# Patient Record
Sex: Male | Born: 1990 | Hispanic: Yes | Marital: Single | Smoking: Current every day smoker
Health system: Southern US, Community
[De-identification: ages and names within clinical notes are randomized; demographics above are authoritative.]

## PROBLEM LIST (undated history)

## (undated) DIAGNOSIS — Z9621 Cochlear implant status: Secondary | ICD-10-CM

## (undated) DIAGNOSIS — F209 Schizophrenia, unspecified: Secondary | ICD-10-CM

## (undated) HISTORY — PX: COCHLEAR IMPLANT: SUR684

---

## 2013-01-15 ENCOUNTER — Emergency Department: Payer: Self-pay | Admitting: Emergency Medicine

## 2013-01-15 LAB — COMPREHENSIVE METABOLIC PANEL
Albumin: 4 g/dL (ref 3.8–5.6)
Alkaline Phosphatase: 64 U/L — ABNORMAL LOW (ref 98–317)
Anion Gap: 9 (ref 7–16)
Bilirubin,Total: 1 mg/dL (ref 0.2–1.0)
Calcium, Total: 8.7 mg/dL — ABNORMAL LOW (ref 9.0–10.7)
Chloride: 108 mmol/L — ABNORMAL HIGH (ref 98–107)
Co2: 23 mmol/L (ref 21–32)
Creatinine: 1.11 mg/dL (ref 0.60–1.30)
EGFR (African American): >60
EGFR (Non-African Amer.): >60
Potassium: 3.4 mmol/L — ABNORMAL LOW (ref 3.5–5.1)
SGOT(AST): 31 U/L (ref 10–41)
Sodium: 140 mmol/L (ref 136–145)
Total Protein: 6.9 g/dL (ref 6.4–8.6)

## 2013-01-15 LAB — CK TOTAL AND CKMB (NOT AT ARMC): CK-MB: 2.1 ng/mL (ref 0.5–3.6)

## 2013-01-15 LAB — URINALYSIS, COMPLETE
Bilirubin,UR: NEGATIVE
Blood: NEGATIVE
Glucose,UR: NEGATIVE mg/dL (ref 0–75)
Hyaline Cast: 4
Nitrite: NEGATIVE
Ph: 7 (ref 4.5–8.0)
Protein: NEGATIVE
RBC,UR: <1 /HPF (ref 0–5)
Squamous Epithelial: NONE SEEN

## 2013-01-15 LAB — DRUG SCREEN, URINE
Amphetamines, Ur Screen: NEGATIVE (ref ?–1000)
Barbiturates, Ur Screen: NEGATIVE (ref ?–200)
Benzodiazepine, Ur Scrn: NEGATIVE (ref ?–200)
Cannabinoid 50 Ng, Ur ~~LOC~~: POSITIVE (ref ?–50)
MDMA (Ecstasy)Ur Screen: NEGATIVE (ref ?–500)
Opiate, Ur Screen: NEGATIVE (ref ?–300)
Phencyclidine (PCP) Ur S: NEGATIVE (ref ?–25)

## 2013-01-15 LAB — CBC WITH DIFFERENTIAL/PLATELET
Basophil #: 0 10*3/uL (ref 0.0–0.1)
Basophil %: 0.3 %
Eosinophil #: 0 10*3/uL (ref 0.0–0.7)
HCT: 50.2 % (ref 40.0–52.0)
Lymphocyte #: 1.1 10*3/uL (ref 1.0–3.6)
MCH: 28.9 pg (ref 26.0–34.0)
MCHC: 33.7 g/dL (ref 32.0–36.0)
MCV: 86 fL (ref 80–100)
Monocyte #: 0.8 x10 3/mm (ref 0.2–1.0)
Neutrophil %: 83.2 %
RDW: 12.6 % (ref 11.5–14.5)
WBC: 11.8 10*3/uL — ABNORMAL HIGH (ref 3.8–10.6)

## 2013-01-15 LAB — TROPONIN I: Troponin-I: <0.02 ng/mL

## 2013-01-15 LAB — ETHANOL
Ethanol %: <0.003 % (ref 0.000–0.080)
Ethanol: <3 mg/dL

## 2013-01-15 LAB — TSH: Thyroid Stimulating Horm: 1.66 u[IU]/mL

## 2014-02-21 LAB — COMPREHENSIVE METABOLIC PANEL
ALT: 19 U/L (ref 12–78)
ANION GAP: 10 (ref 7–16)
Albumin: 4.3 g/dL (ref 3.4–5.0)
Alkaline Phosphatase: 68 U/L
BUN: 12 mg/dL (ref 7–18)
Bilirubin,Total: 0.6 mg/dL (ref 0.2–1.0)
CALCIUM: 9.1 mg/dL (ref 8.5–10.1)
CREATININE: 0.91 mg/dL (ref 0.60–1.30)
Chloride: 101 mmol/L (ref 98–107)
Co2: 27 mmol/L (ref 21–32)
EGFR (African American): >60
EGFR (Non-African Amer.): >60
Glucose: 143 mg/dL — ABNORMAL HIGH (ref 65–99)
Osmolality: 278 (ref 275–301)
Potassium: 3.7 mmol/L (ref 3.5–5.1)
SGOT(AST): 18 U/L (ref 15–37)
SODIUM: 138 mmol/L (ref 136–145)
Total Protein: 8 g/dL (ref 6.4–8.2)

## 2014-02-21 LAB — CBC
HCT: 53.1 % — ABNORMAL HIGH (ref 40.0–52.0)
HGB: 17.9 g/dL (ref 13.0–18.0)
MCH: 29.3 pg (ref 26.0–34.0)
MCHC: 33.8 g/dL (ref 32.0–36.0)
MCV: 87 fL (ref 80–100)
Platelet: 193 10*3/uL (ref 150–440)
RBC: 6.13 10*6/uL — ABNORMAL HIGH (ref 4.40–5.90)
RDW: 13 % (ref 11.5–14.5)
WBC: 8.8 10*3/uL (ref 3.8–10.6)

## 2014-02-21 LAB — URINALYSIS, COMPLETE
BILIRUBIN, UR: NEGATIVE
Bacteria: NONE SEEN
Glucose,UR: NEGATIVE mg/dL (ref 0–75)
Ketone: NEGATIVE
Leukocyte Esterase: NEGATIVE
Nitrite: NEGATIVE
PH: 6 (ref 4.5–8.0)
Protein: NEGATIVE
SQUAMOUS EPITHELIAL: NONE SEEN
Specific Gravity: 1.006 (ref 1.003–1.030)
WBC UR: <1 /HPF (ref 0–5)

## 2014-02-21 LAB — DRUG SCREEN, URINE

## 2014-02-21 LAB — ACETAMINOPHEN LEVEL: Acetaminophen: <2 ug/mL

## 2014-02-21 LAB — ETHANOL: Ethanol: <3 mg/dL

## 2014-02-21 LAB — SALICYLATE LEVEL: Salicylates, Serum: <1.7 mg/dL

## 2014-02-22 ENCOUNTER — Inpatient Hospital Stay: Payer: Self-pay | Admitting: Psychiatry

## 2014-04-05 IMAGING — CT CT HEAD WITHOUT CONTRAST
1 series · 16 of 30 positions shown, 20 images · non-contrast
Comparison: none

REASON FOR EXAM: ams
COMMENTS:

PROCEDURE:     CT  - CT HEAD WITHOUT CONTRAST  - January 15, 2013  [DATE]
RESULT:     Comparison:  None
TECHNIQUE: Multiple axial images from the foramen magnum to the vertex were
obtained without IV contrast.

[Series 2: soft tissue · axial · 0.38mm/px · z∈[+366,+501]mm · 16 of 31 slices shown, 20 images]
[im 2/31  brain]
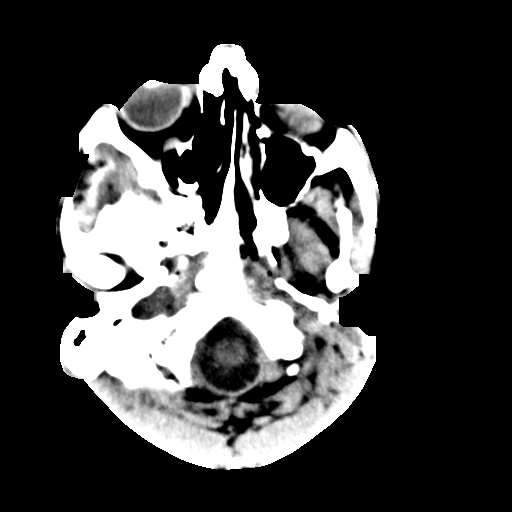
[im 2/31  bone]
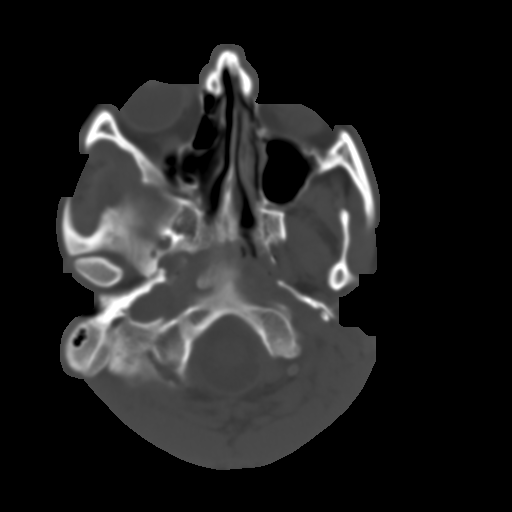
[im 4/31  brain]
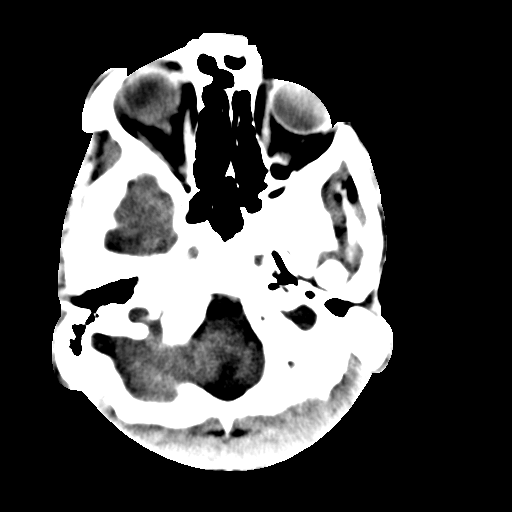
[im 6/31  brain]
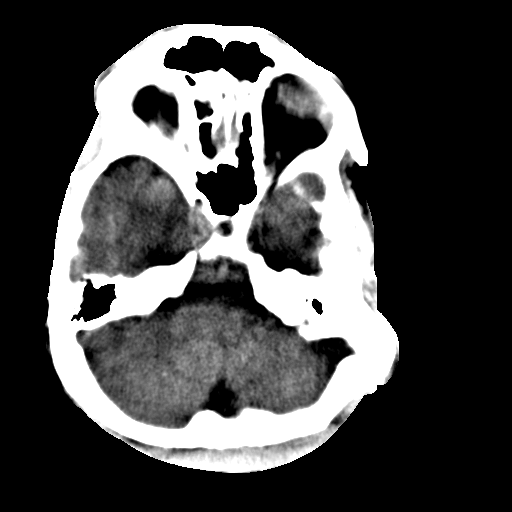
[im 8/31  brain]
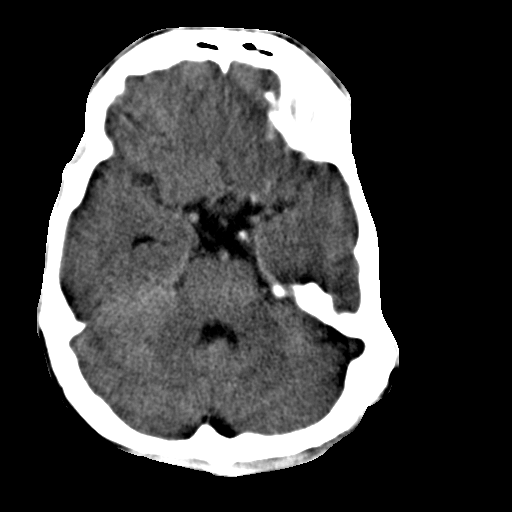
[im 9/31  brain]
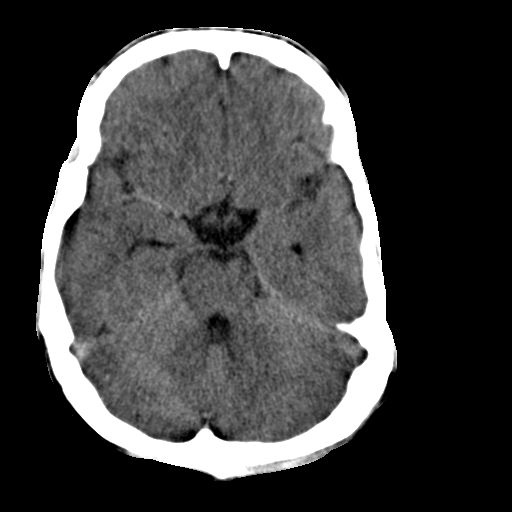
[im 9/31  bone]
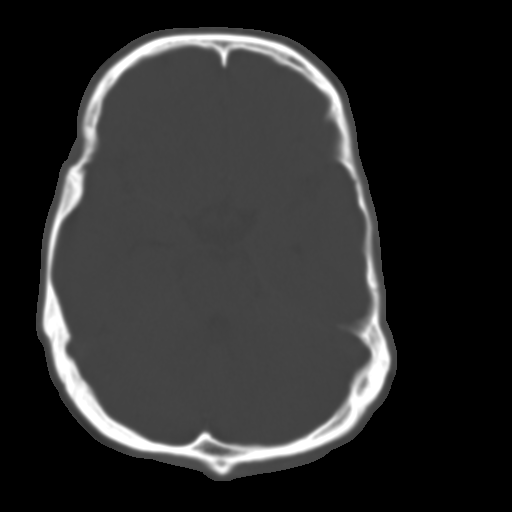
[im 11/31  brain]
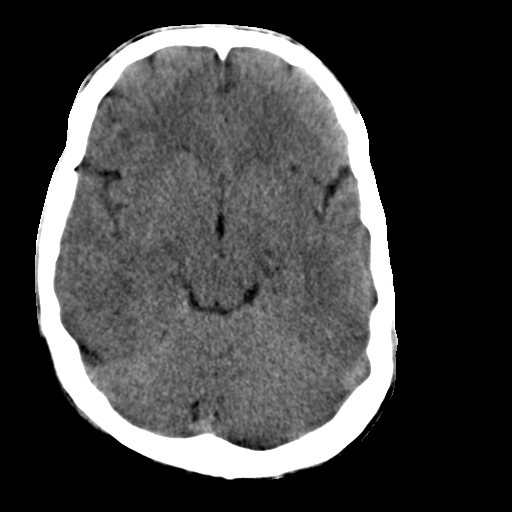
[im 13/31  brain]
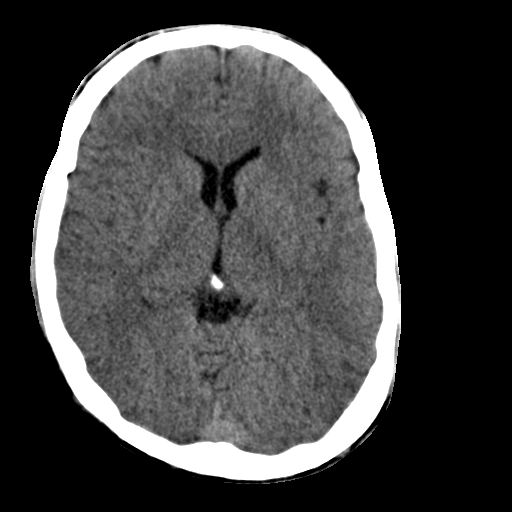
[im 15/31  brain]
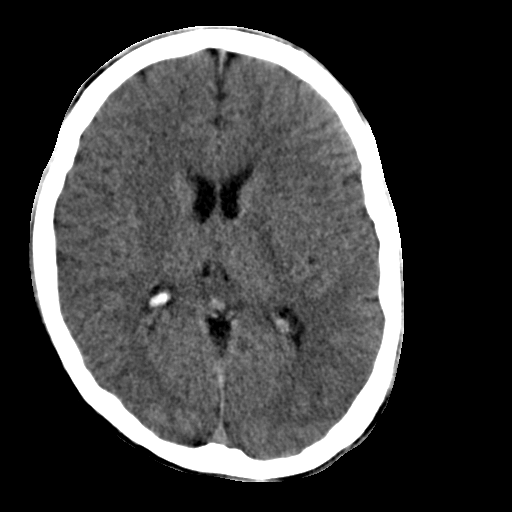
[im 16/31  brain]
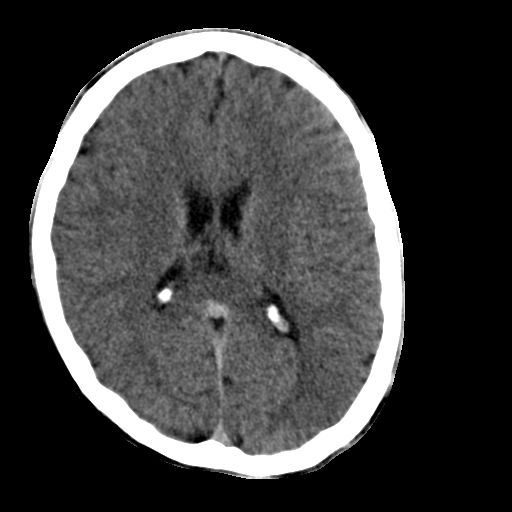
[im 16/31  bone]
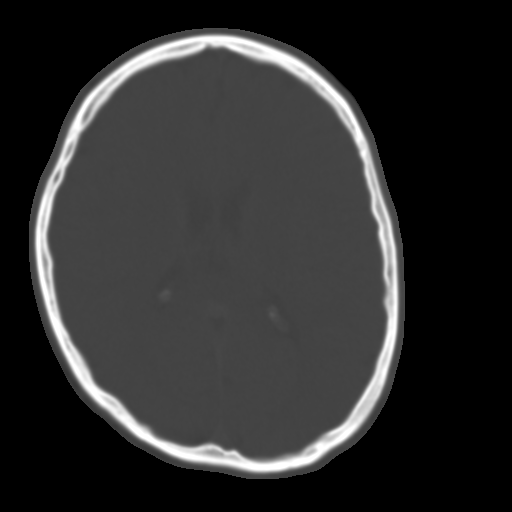
[im 18/31  brain]
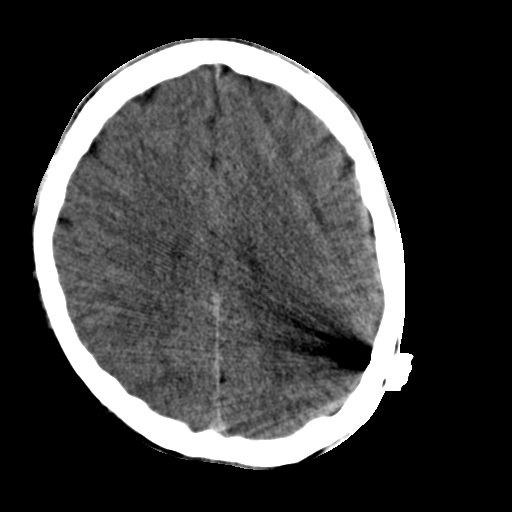
[im 20/31  brain]
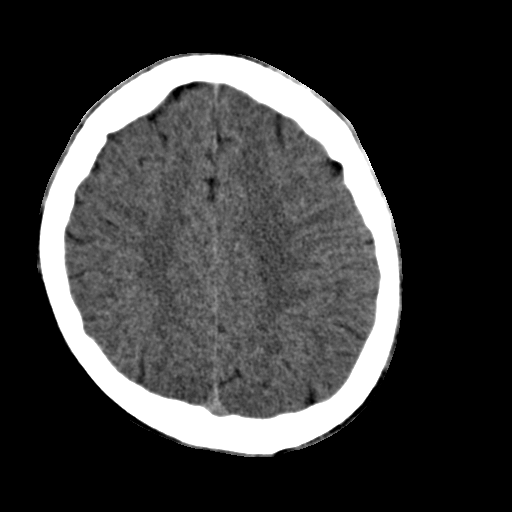
[im 22/31  brain]
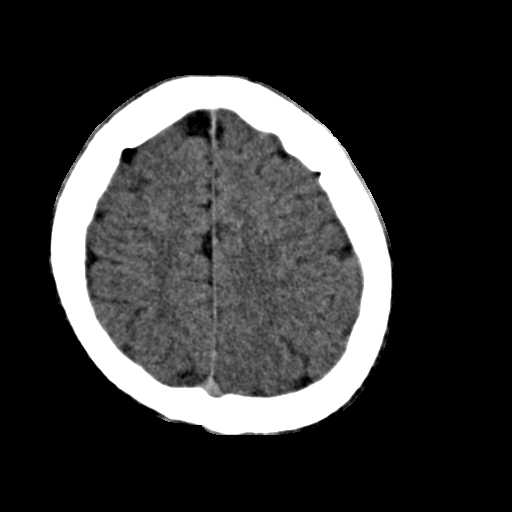
[im 23/31  brain]
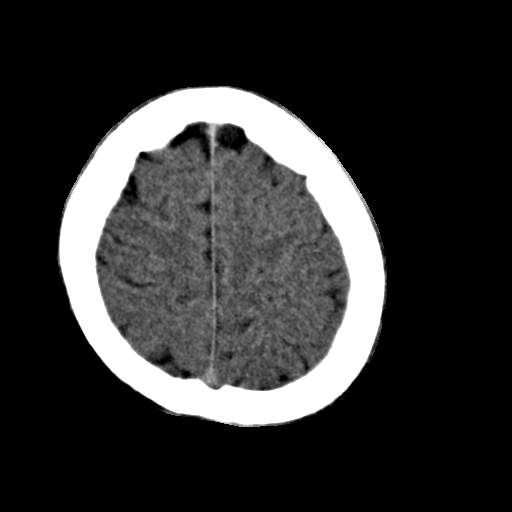
[im 23/31  bone]
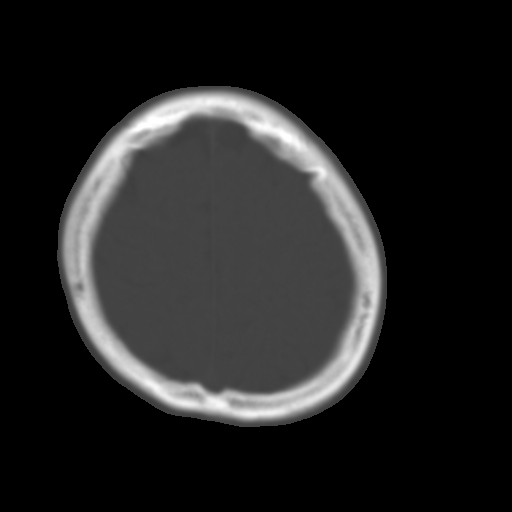
[im 25/31  brain]
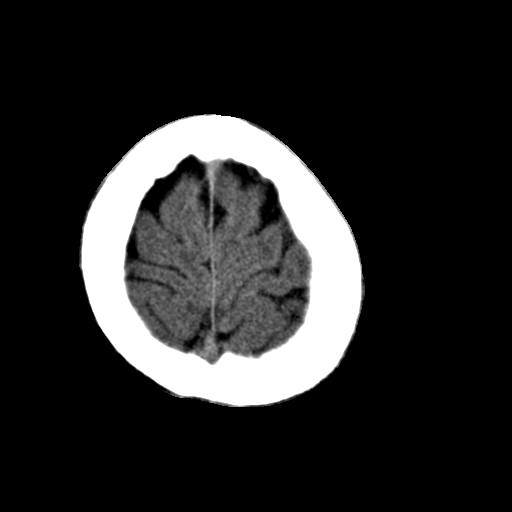
[im 27/31  brain]
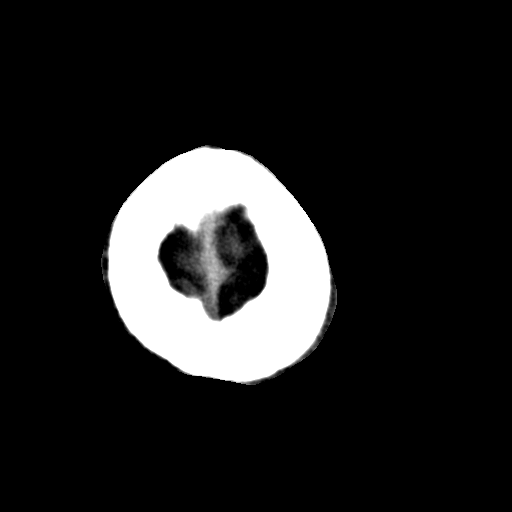
[im 29/31  brain]
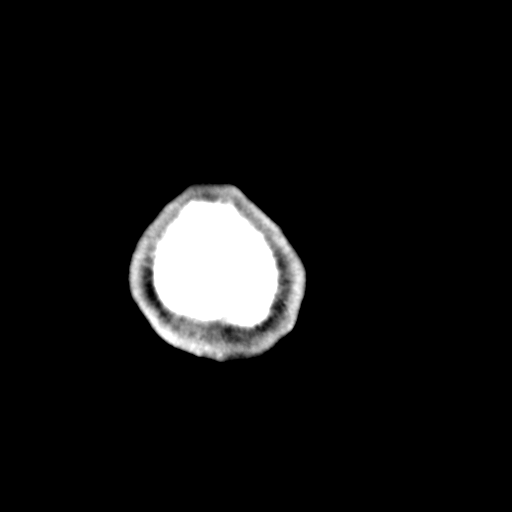

[16 of 30 positions shown; findings below may reference images not displayed]

FINDINGS: There is a metallic density within the left parietal skull and scalp which
is of uncertain etiology. Streak artifact from this density limits
evaluation in this region. There is no evidence for mass effect, midline
shift, or extra-axial fluid collections. There is no evidence for
space-occupying lesion, intracranial hemorrhage, or cortical-based area of
infarction.

The left mastoid air cells are relatively hypoplastic.
IMPRESSION: 1. No acute intracranial findings.
2. There is a metallic object in the left parietal skull and scalp which is
of uncertain etiology. Clinical correlation is recommended.

[REDACTED]

## 2015-01-26 NOTE — Discharge Summary (Signed)
PATIENT NAME:  Justin Schaefer, Slayton E MR#:  [id number] DATE OF BIRTH:  11/23/1990  DATE OF ADMISSION:  02/22/2014 DATE OF DISCHARGE:  02/28/2014   HOSPITAL COURSE:  See dictated history and physical for details of the admission. A 24 year old man presented to the hospital with symptoms of auditory hallucinations, psychotic, disorganized thinking. History strongly suggested new onset schizophrenia. In the hospital, the patient has stayed withdrawn quite a bit of the time. He engages in appropriate conversation when approached, however, and has attended some groups. He was cooperative with medication. He was started on haloperidol. After several days, he showed a resolution in his psychotic symptoms, but also had a dystonic reaction with stiffening of his jaw and neck. He was treated with temporary intramuscular Benadryl and increases in Cogentin. I have cut his Haldol dose back. He currently is taking his current Haldol dose and not having any side effect from it. The patient has been counseled about schizophrenia and the importance of staying on his medicine. He has been counseled about the importance of followup treatment in the community. We are hoping that he can make sure he gets his Medicaid reinstated. He is to follow up with [facility] in the community. At the time of discharge, there is no sign of acute dangerousness. CAT scan done on the first day showed his hearing aid, which is implanted, but did not show any brain abnormality.   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 Admission chemistry: Slightly elevated glucose on nonfasting draw. Alcohol level negative. Drug screen negative. CBC: Elevated hematocrit of 53.1. Urinalysis: Unremarkable.   DISCHARGE MEDICATIONS:  Haloperidol 1 mg twice a day, Cogentin 0.5 mg twice a day.   MENTAL STATUS EXAMINATION AT DISCHARGE: Casually dressed, neatly groomed man, looks his  stated age, cooperative with the interview. Good eye contact, normal psychomotor activity. Speech is quiet and decreased in amount. Affect blunted. Mood stated as fine. Thoughts appear to be slow but lucid. No bizarre thinking. Denies auditory or visual hallucinations. Denies any suicidal or homicidal ideas at all. Alert and oriented x 4. Judgment and insight improved. Normal intelligence. Normal fund of knowledge. Short and long-term memory intact.   DISPOSITION:  Discharge back to his home and follow up with [facility].   DIAGNOSIS, PRINCIPAL AND PRIMARY:  AXIS I: Schizophrenia, paranoid type.   SECONDARY DIAGNOSES: AXIS I:  No further.  AXIS II:  No diagnosis.  AXIS III:  Congenital deafness.  AXIS IV:  Moderate to severe from financial difficulties.  AXIS V:  Functioning at time of discharge 55.    ____________________________ Audery AmelJohn T. Clapacs, MD jtc:dmm D: 02/28/2014 15:03:25 ET T: 02/28/2014 22:07:34 ET JOB#: [id number]  cc: Audery AmelJohn T. Clapacs, MD, <Dictator> Audery AmelJOHN T CLAPACS MD ELECTRONICALLY SIGNED 03/21/2014 13:20

## 2015-01-26 NOTE — H&P (Signed)
PATIENT NAME:  Justin Schaefer, Justin Schaefer MR#:  [id number] DATE OF BIRTH:  Jun 27, 1991  DATE OF ADMISSION:  02/21/2014  HISTORY OF PRESENT ILLNESS: The patient is a 24 year old Hispanic male who presented to the ER accompanied by his family members. He stated that he currently lives with his brother, sister and father. He stated that he has been hearing voices which are getting stronger. He stated that he feels paranoid, that the bugs are crawling, and he could not determine what is going on. He mentioned about the karma which has been going on for the past 1 year. The patient feels that sometimes somebody will push him and he has some special powers. He also mentioned that the TV and radio communicate with him. The patient mentioned that he is having command auditory hallucinations and the voices are calling him to either hurt himself or to hurt other people. The patient reported that he does not take any medications. He reported that he feels scared and so he decided to come to the hospital to seek help.   PAST PSYCHIATRIC HISTORY: The patient reported that he has never seen a psychiatrist and does not take any medications.   SUBSTANCE ABUSE HISTORY: The patient currently denied using any drugs or alcohol.   SOCIAL HISTORY: He currently lives with his family including his brother, sister, and father. [place].   SUBSTANCE ABUSE HISTORY: He denies using drugs or alcohol.   PAST MEDICAL HISTORY: The patient denies having any medical issues.   SOCIAL HISTORY: The patient reported that he has never been admitted to a psychiatric facility.   ANCILLARY DATA: Temperature 97.6, pulse 72, respirations 20, blood pressure 144/81.  LABORATORY DATA: Glucose 143, BUN 12, creatinine 0.91, sodium 138, potassium 3.7, chloride 101, bicarbonate 27, anion gap 10, calcium 9.1. Blood alcohol level less than 3. Protein 8, albumin 4.3, bilirubin 0.6, alkaline phosphatase 68, ALT 19, AST 18. UDS is negative.  WBC 8.8, RBC 6.13, hemoglobin 17.9, platelet count 193,000, MCV 87, RDW 13.  REVIEW OF SYSTEMS:  CONSTITUTIONAL: Denies any fever or chills. No weight changes.  EYES: No double or blurred vision.  RESPIRATORY: No shortness of breath or cough.  CARDIOVASCULAR: No chest pain or orthopnea.  GASTROINTESTINAL: No abdominal pain, nausea, vomiting or diarrhea.  GENITOURINARY: No incontinence or frequency.  ENDOCRINE: No heat or cold intolerance.  LYMPHATIC: No anemia or easy bruising.  INTEGUMENTARY: No acne or rash.   MENTAL STATUS EXAMINATION: The patient is a short statured male who was sitting in the bed. He maintained fair eye contact. His gait and station appears within normal limits. Speech was low in tone and volume. Thought process was tangential. Admits to having auditory and visual hallucinations. Denied having any suicidal ideations. Demonstrated poor insight and judgment. Awake, alert, and oriented x3. Attention span and concentration were normal. Mood was depressed. Affect was blunted.   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  AXIS II: None.  AXIS III: None. AXIS IV: Severe, current global assessment of functioning 25.   TREATMENT PLAN: 1.  The patient is currently on involuntary commitment and will be admitted to the inpatient behavioral health unit for stabilization and safety.  2.  He is started on Haldol 5 mg p.o. b.i.d. for his paranoia.  3.  We will also add Cogentin 0.5 mg p.o. b.i.d.   4.  He will get trazodone to help with insomnia. Will obtain collateral information from his family members.  He will be involved in group and milieu therapy while in the inpatient unit.  Thank you for allowing me to participate in the care of this patient.    ____________________________ Ardeen Fillers. Garnetta Buddy, MD usf:sb D: 02/22/2014 16:32:24 ET T: 02/22/2014 16:52:39 ET JOB#: [id number]  cc: Ardeen Fillers. Garnetta Buddy, MD, <Dictator> Rhunette Croft MD ELECTRONICALLY SIGNED 02/27/2014 16:04

## 2015-01-26 NOTE — Consult Note (Signed)
PATIENT NAME:  Justin Schaefer, Justin Schaefer MR#:  [id number] DATE OF BIRTH:  [birth year]  DATE OF CONSULTATION:  02/22/2014  REFERRING PHYSICIAN:  Glennie Isle, MD CONSULTING PHYSICIAN:  Ardeen Fillers. Garnetta Buddy, MD  REASON FOR CONSULTATION: "I am hearing voices."    HISTORY OF PRESENT ILLNESS: The patient is a 24 year old Hispanic male who presented to the ED by the police stating that he called the police and told them that I am hearing voices. During my interview, the patient reported that he has been hearing voices and feels that the food is contaminated. The patient was having some difficulty explaining his symptoms. However, he reported that he has some paranormal activity and feels that there are bugs. He reported there is some karma going on. He stated that he has been having these symptoms for the past 1 year. He reported that sometimes that they are special powers who will push him. He reported that he can communicate through the TV and hear voices through the radio that are talking to him. He reported that the voices are asking him to kill himself. He reported that he called the police as the voices were getting stronger and they were asking him to hurt himself as well as others. The patient reported that there are different voices and sometimes they communicate to each other. He reported that he was getting scared so he decided to come to the hospital. The patient stated that he lives with his brother and sister as well as his father, but his mother is currently in [place]. He appeared calm and collective when he was trying to explain all these symptoms.   PAST PSYCHIATRIC HISTORY: The patient reported that he does not see any psychiatrist at this time and is not taking any medications. He does not use any drugs or alcohol.   PAST MEDICAL HISTORY: The patient currently denied any medical issues at this time.   ALLERGIES: No known drug allergies.  SUBSTANCE ABUSE HISTORY: The patient denied any history of  using any illicit drugs at this time.   SOCIAL HISTORY: The patient reported that he stays at home and lives with his family members. His mother is in [place]. No pending legal charges.   ANCILLARY DATA: Temperature 97.6, pulse 72, respirations 20, blood pressure 144/81.  LABORATORY DATA: Glucose 143, BUN 12, creatinine 0.91, sodium 138, potassium 3.7, chloride 101, bicarbonate 27, anion gap 10, osmolality 278, calcium 9.1. Blood alcohol less than 3. Protein 8, albumin 4.3, bilirubin 0.6, alkaline phosphatase 68, AST 18, ALT 19. UDS is negative. WBC 8.8, RBC 6.13, hemoglobin 17.9, hematocrit 3.1, platelet count 193,000, MCV 87, RDW 13.  REVIEW OF SYSTEMS:  CONSTITUTIONAL: Denied any fever or chills. Negative for weight loss.  EYES: No double or blurred vision.  ENT: No hearing loss.  RESPIRATORY: No shortness of breath or cough.  CARDIOVASCULAR: No chest pain or orthopnea.  GASTROINTESTINAL: No abdominal pain, nausea, vomiting or diarrhea.  GENITOURINARY: No incontinence or frequency.  ENDOCRINE: No heat or cold intolerance.  LYMPHATIC: No anemia or easy bruising.  INTEGUMENTARY: No acne or rash.  MUSCULOSKELETAL: No muscle or joint pain.  NEUROLOGIC: No history of seizures.   MENTAL STATUS EXAMINATION: The patient is a thinly built male who appeared his stated age. He was calm and cooperative and sitting in the bed. He was awake, alert and oriented x3. He maintained fair eye contact. His mood was depressed and affect was blunted. He reported having auditory hallucinations with the voices talking to  each other. He also admits to having command auditory hallucinations. He demonstrated fair insight and judgment. His memory appears intact.   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  AXIS II: None.  AXIS III: None reported.  AXIS IV: Severe mental illness.  AXIS V: Current global assessment of functioning 25.   TREATMENT PLAN: 1.   The patient is currently on involuntary commitment and will be admitted to the inpatient behavioral health unit for stabilization and safety.  2.  I will start him on Haldol 5 mg p.o. b.i.d.  3.  I will also start him on Cogentin 0.5 mg p.o. b.i.d.  4.  He will be given trazodone 50 mg at bedtime for insomnia. The patient will be monitored closely by the behavioral health staff and his medications will be adjusted according to his needs.   Thank you for allowing me to participate in the care of this patient.   ____________________________ Ardeen FillersUzma S. Garnetta BuddyFaheem, MD usf:sb D: 02/22/2014 14:52:26 ET T: 02/22/2014 17:08:34 ET JOB#: [id number]  cc: Ardeen FillersUzma S. Garnetta BuddyFaheem, MD, <Dictator> Rhunette CroftUZMA S Takelia Urieta MD ELECTRONICALLY SIGNED 02/27/2014 16:04

## 2015-02-13 ENCOUNTER — Encounter: Payer: Self-pay | Admitting: Emergency Medicine

## 2015-02-13 ENCOUNTER — Emergency Department
Admission: EM | Admit: 2015-02-13 | Discharge: 2015-02-15 | Disposition: A | Discharge status: Other Acute Inpatient Hospital | Payer: Medicaid Other | Attending: Student | Admitting: Student

## 2015-02-13 DIAGNOSIS — F99 Mental disorder, not otherwise specified: Secondary | ICD-10-CM | POA: Diagnosis not present

## 2015-02-13 LAB — URINE DRUG SCREEN, QUALITATIVE (ARMC ONLY)
Amphetamines, Ur Screen: NOT DETECTED
BARBITURATES, UR SCREEN: NOT DETECTED
Benzodiazepine, Ur Scrn: NOT DETECTED
Cannabinoid 50 Ng, Ur ~~LOC~~: POSITIVE — AB
Cocaine Metabolite,Ur ~~LOC~~: NOT DETECTED
MDMA (ECSTASY) UR SCREEN: NOT DETECTED
METHADONE SCREEN, URINE: NOT DETECTED
Opiate, Ur Screen: NOT DETECTED
Phencyclidine (PCP) Ur S: NOT DETECTED
TRICYCLIC, UR SCREEN: NOT DETECTED

## 2015-02-13 LAB — COMPREHENSIVE METABOLIC PANEL
ALBUMIN: 4.5 g/dL (ref 3.5–5.0)
ALT: 17 U/L (ref 17–63)
AST: 20 U/L (ref 15–41)
Alkaline Phosphatase: 57 U/L (ref 38–126)
Anion gap: 8 (ref 5–15)
BILIRUBIN TOTAL: 0.6 mg/dL (ref 0.3–1.2)
BUN: 13 mg/dL (ref 6–20)
CHLORIDE: 102 mmol/L (ref 101–111)
CO2: 30 mmol/L (ref 22–32)
Calcium: 9.4 mg/dL (ref 8.9–10.3)
Creatinine, Ser: 0.87 mg/dL (ref 0.61–1.24)
GFR calc Af Amer: >60 mL/min (ref >60)
GFR calc non Af Amer: >60 mL/min (ref >60)
Glucose, Bld: 99 mg/dL (ref 65–99)
Potassium: 4 mmol/L (ref 3.5–5.1)
SODIUM: 140 mmol/L (ref 135–145)
Total Protein: 7.3 g/dL (ref 6.5–8.1)

## 2015-02-13 LAB — URINALYSIS COMPLETE WITH MICROSCOPIC (ARMC ONLY)
Bacteria, UA: NONE SEEN
Bilirubin Urine: NEGATIVE
Glucose, UA: NEGATIVE mg/dL
HGB URINE DIPSTICK: NEGATIVE
Ketones, ur: NEGATIVE mg/dL
LEUKOCYTES UA: NEGATIVE
Nitrite: NEGATIVE
PROTEIN: NEGATIVE mg/dL
Specific Gravity, Urine: 1.006 (ref 1.005–1.030)
Squamous Epithelial / LPF: NONE SEEN
WBC, UA: NONE SEEN WBC/hpf (ref 0–5)
pH: 7 (ref 5.0–8.0)

## 2015-02-13 LAB — CBC WITH DIFFERENTIAL/PLATELET
BASOS ABS: 0 10*3/uL (ref 0–0.1)
Basophils Relative: 1 %
Eosinophils Absolute: 0.2 10*3/uL (ref 0–0.7)
Eosinophils Relative: 3 %
HCT: 50.1 % (ref 40.0–52.0)
HEMOGLOBIN: 16.6 g/dL (ref 13.0–18.0)
LYMPHS PCT: 35 %
Lymphs Abs: 2.6 10*3/uL (ref 1.0–3.6)
MCH: 28.7 pg (ref 26.0–34.0)
MCHC: 33.1 g/dL (ref 32.0–36.0)
MCV: 86.5 fL (ref 80.0–100.0)
Monocytes Absolute: 0.7 10*3/uL (ref 0.2–1.0)
Monocytes Relative: 10 %
NEUTROS ABS: 3.9 10*3/uL (ref 1.4–6.5)
Neutrophils Relative %: 51 %
Platelets: 173 10*3/uL (ref 150–440)
RBC: 5.8 MIL/uL (ref 4.40–5.90)
RDW: 13.2 % (ref 11.5–14.5)
WBC: 7.6 10*3/uL (ref 3.8–10.6)

## 2015-02-13 LAB — ETHANOL: Alcohol, Ethyl (B): <5 mg/dL (ref <5)

## 2015-02-13 LAB — ACETAMINOPHEN LEVEL

## 2015-02-13 LAB — SALICYLATE LEVEL

## 2015-02-13 NOTE — ED Notes (Signed)
BEHAVIORAL HEALTH ROUNDING Patient sleeping: No. Patient alert and oriented: yes Behavior appropriate: Yes.  ; If no, describe:  Nutrition and fluids offered: Yes  Toileting and hygiene offered: Yes  Sitter present: yes Law enforcement present: Yes  

## 2015-02-13 NOTE — ED Notes (Signed)

## 2015-02-13 NOTE — ED Notes (Signed)
Patient states he has "pressure in his head" for last two years and wants it gone.

## 2015-02-13 NOTE — ED Notes (Signed)
BEHAVIORAL HEALTH ROUNDING Patient sleeping: Yes.   Patient alert and oriented: yes Behavior appropriate: Yes.  ; If no, describe:  Nutrition and fluids offered: Yes  Toileting and hygiene offered: Yes  Sitter present: yes Law enforcement present: Yes  

## 2015-02-13 NOTE — ED Notes (Signed)
Hearing voices

## 2015-02-13 NOTE — ED Provider Notes (Signed)
[facility] Emergency Department Provider Note  ____________________________________________  Time seen: Approximately 8:17 PM  I have reviewed the triage vital signs and the nursing notes.   HISTORY  Chief Complaint Mental Health Problem    HPI Justin BostonCarlos E Schaefer is a 24 y.o. male who presents to the emergency department with a chief complaint of "hearing voices". He also complains of having "pressure in his head" for the last 2 years and he wants it gone. [facility] [place] [facility] to bring him to the emergency department today seeking care. He denies suicidal thoughts or homicidal thoughts. He has never been hospitalized or on any psychiatric medication or treatment. He admits to hearing voices for several months. They are not telling him to hurt himself or anybody else.   History reviewed. No pertinent past medical history.  There are no active problems to display for this patient.   History reviewed. No pertinent past surgical history.  No current outpatient prescriptions on file.  Allergies Review of patient's allergies indicates no known allergies.  No family history on file.  Social History History  Substance Use Topics  . Smoking status: Never Smoker   . Smokeless tobacco: Not on file  . Alcohol Use: No    Review of Systems Constitutional: No fever/chills Eyes: No visual changes. ENT: No sore throat. Cardiovascular: Denies chest pain. Respiratory: Denies shortness of breath. Gastrointestinal: No abdominal pain.  No nausea, no vomiting.  No diarrhea.  No constipation. Genitourinary: Negative for dysuria. Musculoskeletal: Negative for back pain. Skin: Negative for rash. Neurological: Positive for headaches, negative for focal weakness or numbness. Psychiatric: Patient is having auditory hallucinations.  10-point ROS otherwise negative.  ____________________________________________   PHYSICAL  EXAM:  VITAL SIGNS: ED Triage Vitals  Enc Vitals Group     BP 02/13/15 1737 134/82 mmHg     Pulse Rate 02/13/15 1737 115     Resp --      Temp 02/13/15 1737 98.3 F (36.8 C)     Temp src --      SpO2 02/13/15 1737 98 %     Weight 02/13/15 1737 140 lb (63.504 kg)     Height 02/13/15 1737 5\' 7"  (1.702 m)     Head Cir --      Peak Flow --      Pain Score --      Pain Loc --      Pain Edu? --      Excl. in GC? --     Constitutional: Alert and oriented. Well appearing and in no acute distress. Eyes: Conjunctivae are normal. PERRL. EOMI. Head: Atraumatic. Nose: No congestion/rhinnorhea. Mouth/Throat: Mucous membranes are moist.  Oropharynx non-erythematous. Neck: No stridor.   Cardiovascular: Normal rate, regular rhythm. Grossly normal heart sounds.  Good peripheral circulation. Respiratory: Normal respiratory effort.  No retractions. Lungs CTAB. Gastrointestinal: Soft and nontender. No distention. No abdominal bruits. No CVA tenderness. Musculoskeletal: No lower extremity tenderness nor edema.  No joint effusions. Neurologic:  Normal speech and language. No gross focal neurologic deficits are appreciated. Speech is normal. No gait instability. Skin:  Skin is warm, dry and intact. No rash noted. Psychiatric: Mood and affect are flat. Patient appears to be having auditory hallucinations. ____________________________________________   LABS (all labs ordered are listed, but only abnormal results are displayed)   ___ Labs Reviewed  URINALYSIS COMPLETEWITH MICROSCOPIC ([facility])  - Abnormal; Notable for the following:    Color, Urine STRAW (*)    APPearance  CLEAR (*)    All other components within normal limits  URINE DRUG SCREEN, QUALITATIVE ([facility]) - Abnormal; Notable for the following:    Cannabinoid 50 Ng, Ur [place] POSITIVE (*)    All other components within normal limits  CBC WITH DIFFERENTIAL/PLATELET  COMPREHENSIVE METABOLIC PANEL  ETHANOL  ACETAMINOPHEN LEVEL  SALICYLATE  LEVEL  _________________________________________  EKG   ____________________________________________  RADIOLOGY   ____________________________________________   PROCEDURES  Procedure(s) performed: None  Critical Care performed: No  ____________________________________________   INITIAL IMPRESSION / ASSESSMENT AND PLAN / ED COURSE  Pertinent labs & imaging results that were available during my care of the patient were reviewed by me and considered in my medical decision making (see chart for details).  Patient is a 24 year old gentleman who presented to the emergency department with a chief complaint of auditory hallucinations. He also complains of having pressure in his head for the past 2 years and he wants it gone.  He denies suicidal and homicidal ideology. The behavioral medicine intake and psychiatric consult were ordered. 1. ________________________________________ ----------------------------------------- 11:32 PM on 02/13/2015 -----------------------------------------  Patient signed off to Dr. Lesly RubensteinJade sung labs still pending . FINAL CLINICAL IMPRESSION(S) / ED DIAGNOSES    Final diagnoses:  None   1. Auditory hallucinations   Sherlyn HaySheryl L [name], DO 02/13/15 2333

## 2015-02-14 MED ORDER — NICOTINE 10 MG IN INHA
RESPIRATORY_TRACT | Status: AC
  Administered 2015-02-14: 1 via RESPIRATORY_TRACT
  Filled 2015-02-14: qty 36

## 2015-02-14 MED ORDER — NICOTINE 10 MG IN INHA
1.0000 | RESPIRATORY_TRACT | Status: DC | PRN
  Administered 2015-02-14: 1 via RESPIRATORY_TRACT

## 2015-02-14 NOTE — ED Notes (Signed)
BEHAVIORAL HEALTH ROUNDING Patient sleeping: Yes.   Patient alert and oriented: not applicable Behavior appropriate: Yes.  ; If no, describe:  Nutrition and fluids offered: Yes  Toileting and hygiene offered: Yes  Sitter present: yes Law enforcement present: Yes  

## 2015-02-14 NOTE — ED Notes (Signed)
BEHAVIORAL HEALTH ROUNDING Patient sleeping: No. Patient alert and oriented: yes Behavior appropriate: Yes.  ; If no, describe:  Nutrition and fluids offered: Yes  Toileting and hygiene offered: Yes  Sitter present: yes Law enforcement present: Yes  

## 2015-02-14 NOTE — ED Provider Notes (Signed)
-----------------------------------------   6:53 AM on 02/14/2015 -----------------------------------------   BP 134/82 mmHg  Pulse 115  Temp(Src) 98.3 F (36.8 C)  Ht 5\' 7"  (1.702 m)  Wt 140 lb (63.504 kg)  BMI 21.92 kg/m2  SpO2 98%  The patient had no acute events since last update.  Calm and cooperative at this time.  Disposition is pending per Psychiatry/Behavioral Medicine team recommendations.     Irean HongJade J Jaysean Manville, MD 02/14/15 [phone]

## 2015-02-14 NOTE — Consult Note (Signed)
[place] [facility] Consult   Reason for Consult:  Patient with psychotic symptoms presents to the hospital. Consult for appropriate management Referring Physician:  gayle Patient Identification: Justin Schaefer MRN:  [id number] Principal Diagnosis: Schizophrenia Diagnosis:   Patient Active Problem List   Diagnosis Date Noted  . Schizophrenia [F20.9] 02/14/2015    Total Time spent with patient: 1 hour  Subjective:   Justin Schaefer is a 24 y.o. male patient admitted with patient referred himself to the emergency room because he has been having auditory hallucinations that have been harassing him and making him upset and agitated. Chief complaint "they harass me. Also "I called the police for some reason".  HPI:  Information from the patient and the chart. Patient says he called 911 himself because he was not feeling right. He's been hearing voices which should been harassing him. Thinks it's been getting worse although it's been chronic. They bother her and upset him is his been bad. He is not sleeping well at night. He denies that he is having any suicidal ideation and denies homicidal ideation. Says his usual activity is not doing much sitting around the house. Denies that he has been drinking or using any drugs although he later admits to occasional use of marijuana. Doesn't report any specific new stress. Justin Schaefer patient had a prior admission 1 year ago with similar symptoms and at that time was diagnosed with what was probably new onset schizophrenia. He responded well to antipsychotics but says that he only stayed in treatment for a couple months after discharge and hasn't been taking medicine in a long time. Denies any history of suicide attempts or violence in the past.  Substance abuse history is that he denies use of alcohol and says he uses marijuana only infrequently but won't be more specific about it.  Social history is that he lives with his brother. He and his brother  only 1 year apart in age. Brother apparently works outside the home but the patient stays home and doesn't do much because of his disability. Has some other family contact but he is vague about it.  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 No other known medical problem.  Family history: Patient knows of no family history of mental illness HPI Elements:   Quality:  Auditory hallucinations that have been upsetting him. Severity:  Moderate to severe. Timing:  Getting worse over the last couple weeks. Duration:  Chronic and of been there for over a year. Context:  No specific known ongoing stress although he is not compliant with any outpatient treatment.  Past Medical History: History reviewed. No pertinent past medical history. History reviewed. No pertinent past surgical history. Family History: No family history on file. Social History:  History  Alcohol Use No     History  Drug Use Not on file    History   Social History  . Marital Status: Single    Spouse Name: N/A  . Number of Children: N/A  . Years of Education: N/A   Social History Main Topics  . Smoking status: Never Smoker   . Smokeless tobacco: Not on file  . Alcohol Use: No  . Drug Use: Not on file  . Sexual Activity: Not on file   Other Topics Concern  . None   Social History Narrative  . None   Additional Social History:  Allergies:  No Known Allergies  Labs:  Results for orders placed or performed during the hospital encounter of 02/13/15 (from the past 48 hour(s))  CBC WITH DIFFERENTIAL     Status: None   Collection Time: 02/13/15  5:44 PM  Result Value Ref Range   WBC 7.6 3.8 - 10.6 K/uL   RBC 5.80 4.40 - 5.90 MIL/uL   Hemoglobin 16.6 13.0 - 18.0 g/dL   HCT 50.1 40.0 - 52.0 %   MCV 86.5 80.0 - 100.0 fL   MCH 28.7 26.0 - 34.0 pg   MCHC 33.1 32.0 - 36.0 g/dL   RDW 13.2 11.5 - 14.5 %   Platelets 173 150 - 440 K/uL    Neutrophils Relative % 51 %   Neutro Abs 3.9 1.4 - 6.5 K/uL   Lymphocytes Relative 35 %   Lymphs Abs 2.6 1.0 - 3.6 K/uL   Monocytes Relative 10 %   Monocytes Absolute 0.7 0.2 - 1.0 K/uL   Eosinophils Relative 3 %   Eosinophils Absolute 0.2 0 - 0.7 K/uL   Basophils Relative 1 %   Basophils Absolute 0.0 0 - 0.1 K/uL  Comprehensive metabolic panel     Status: None   Collection Time: 02/13/15  5:44 PM  Result Value Ref Range   Sodium 140 135 - 145 mmol/L   Potassium 4.0 3.5 - 5.1 mmol/L   Chloride 102 101 - 111 mmol/L   CO2 30 22 - 32 mmol/L   Glucose, Bld 99 65 - 99 mg/dL   BUN 13 6 - 20 mg/dL   Creatinine, Ser 0.87 0.61 - 1.24 mg/dL   Calcium 9.4 8.9 - 10.3 mg/dL   Total Protein 7.3 6.5 - 8.1 g/dL   Albumin 4.5 3.5 - 5.0 g/dL   AST 20 15 - 41 U/L   ALT 17 17 - 63 U/L   Alkaline Phosphatase 57 38 - 126 U/L   Total Bilirubin 0.6 0.3 - 1.2 mg/dL   GFR calc non Af Amer >60 >60 mL/min   GFR calc Af Amer >60 >60 mL/min    Comment: (NOTE) The eGFR has been calculated using the CKD EPI equation. This calculation has not been validated in all clinical situations. eGFR's persistently <60 mL/min signify possible Chronic Kidney Disease.    Anion gap 8 5 - 15  Ethanol     Status: None   Collection Time: 02/13/15  5:44 PM  Result Value Ref Range   Alcohol, Ethyl (B) <5 <5 mg/dL    Comment:        LOWEST DETECTABLE LIMIT FOR SERUM ALCOHOL IS 11 mg/dL FOR MEDICAL PURPOSES ONLY   Urinalysis complete, with microscopic [facility])     Status: Abnormal   Collection Time: 02/13/15  5:44 PM  Result Value Ref Range   Color, Urine STRAW (A) YELLOW   APPearance CLEAR (A) CLEAR   Glucose, UA NEGATIVE NEGATIVE mg/dL   Bilirubin Urine NEGATIVE NEGATIVE   Ketones, ur NEGATIVE NEGATIVE mg/dL   Specific Gravity, Urine 1.006 1.005 - 1.030   Hgb urine dipstick NEGATIVE NEGATIVE   pH 7.0 5.0 - 8.0   Protein, ur NEGATIVE NEGATIVE mg/dL   Nitrite NEGATIVE NEGATIVE   Leukocytes, UA NEGATIVE NEGATIVE    RBC / HPF 0-5 0 - 5 RBC/hpf   WBC, UA NONE SEEN 0 - 5 WBC/hpf   Bacteria, UA NONE SEEN NONE SEEN   Squamous Epithelial / LPF NONE SEEN NONE SEEN  Urine Drug Screen, Qualitative [facility])  Status: Abnormal   Collection Time: 02/13/15  5:44 PM  Result Value Ref Range   Tricyclic, Ur Screen NONE DETECTED NONE DETECTED   Amphetamines, Ur Screen NONE DETECTED NONE DETECTED   MDMA (Ecstasy)Ur Screen NONE DETECTED NONE DETECTED   Cocaine Metabolite,Ur [place] NONE DETECTED NONE DETECTED   Opiate, Ur Screen NONE DETECTED NONE DETECTED   Phencyclidine (PCP) Ur S NONE DETECTED NONE DETECTED   Cannabinoid 50 Ng, Ur  POSITIVE (A) NONE DETECTED   Barbiturates, Ur Screen NONE DETECTED NONE DETECTED   Benzodiazepine, Ur Scrn NONE DETECTED NONE DETECTED   Methadone Scn, Ur NONE DETECTED NONE DETECTED    Comment: (NOTE) 174  Tricyclics, urine               Cutoff 1000 ng/mL 200  Amphetamines, urine             Cutoff 1000 ng/mL 300  MDMA (Ecstasy), urine           Cutoff 500 ng/mL 400  Cocaine Metabolite, urine       Cutoff 300 ng/mL 500  Opiate, urine                   Cutoff 300 ng/mL 600  Phencyclidine (PCP), urine      Cutoff 25 ng/mL 700  Cannabinoid, urine              Cutoff 50 ng/mL 800  Barbiturates, urine             Cutoff 200 ng/mL 900  Benzodiazepine, urine           Cutoff 200 ng/mL 1000 Methadone, urine                Cutoff 300 ng/mL 1100 1200 The urine drug screen provides only a preliminary, unconfirmed 1300 analytical test result and should not be used for non-medical 1400 purposes. Clinical consideration and professional judgment should 1500 be applied to any positive drug screen result due to possible 1600 interfering substances. A more specific alternate chemical method 1700 must be used in order to obtain a confirmed analytical result.  1800 Gas chromato graphy / mass spectrometry (GC/MS) is the preferred 1900 confirmatory method.   Acetaminophen level     Status: Abnormal    Collection Time: 02/13/15  5:44 PM  Result Value Ref Range   Acetaminophen (Tylenol), Serum <10 (L) 10 - 30 ug/mL    Comment:        THERAPEUTIC CONCENTRATIONS VARY SIGNIFICANTLY. A RANGE OF 10-30 ug/mL MAY BE AN EFFECTIVE CONCENTRATION FOR MANY PATIENTS. HOWEVER, SOME ARE BEST TREATED AT CONCENTRATIONS OUTSIDE THIS RANGE. ACETAMINOPHEN CONCENTRATIONS >150 ug/mL AT 4 HOURS AFTER INGESTION AND >50 ug/mL AT 12 HOURS AFTER INGESTION ARE OFTEN ASSOCIATED WITH TOXIC REACTIONS.   Salicylate level     Status: None   Collection Time: 02/13/15  5:44 PM  Result Value Ref Range   Salicylate Lvl <0.8 2.8 - 30.0 mg/dL    Vitals: Blood pressure 113/76, pulse 67, temperature 97.6 F (36.4 C), temperature source Oral, height '5\' 7"'  (1.702 m), weight 63.504 kg (140 lb), SpO2 95 %.  Risk to Self: Is patient at risk for suicide?: No Risk to Others:   Prior Inpatient Therapy:   Prior Outpatient Therapy:    No current facility-administered medications for this encounter.   No current outpatient prescriptions on file.    Musculoskeletal: Strength & Muscle Tone: within normal limits Gait & Station: normal Patient leans: N/A  Psychiatric Specialty Exam:  Physical Exam  Constitutional: He is oriented to person, place, and time. He appears well-developed and well-nourished.  HENT:  Head: Normocephalic.  Eyes: Pupils are equal, round, and reactive to light.  Neck: Normal range of motion.  Respiratory: Effort normal.  Musculoskeletal: Normal range of motion.  Neurological: He is alert and oriented to person, place, and time.  Skin: Skin is warm and dry.  Psychiatric: His mood appears anxious. His affect is blunt. His speech is delayed. He is withdrawn. Thought content is delusional. Cognition and memory are impaired. He expresses inappropriate judgment. He exhibits a depressed mood. He exhibits abnormal recent memory.    Review of Systems  Constitutional: Negative.   HENT: Negative.    Eyes: Negative.   Respiratory: Negative.   Cardiovascular: Negative.   Gastrointestinal: Negative.   Skin: Negative.   Neurological: Negative.   Psychiatric/Behavioral: Positive for depression, suicidal ideas and hallucinations. Negative for substance abuse. The patient is nervous/anxious and has insomnia.     Blood pressure 113/76, pulse 67, temperature 97.6 F (36.4 C), temperature source Oral, height '5\' 7"'  (1.702 m), weight 63.504 kg (140 lb), SpO2 95 %.Body mass index is 21.92 kg/(m^2).  General Appearance: Fairly Groomed  Engineer, water::  Fair  Speech:  Slow  Volume:  Decreased  Mood:  Dysphoric  Affect:  Flat  Thought Process:  Loose  Orientation:  Full (Time, Place, and Person)  Thought Content:  Hallucinations: Auditory  Suicidal Thoughts:  No  Homicidal Thoughts:  No  Memory:  Immediate;   Good Recent;   Fair Remote;   Fair  Judgement:  Fair  Insight:  Fair  Psychomotor Activity:  Decreased  Concentration:  Fair  Recall:  [employer] of Knowledge:Good  Language: Good  Akathisia:  No  Handed:  Right  AIMS (if indicated):     Assets:  Desire for Improvement Housing Intimacy Leisure Time  ADL's:  Intact  Cognition: WNL  Sleep:      Medical Decision Making: Established Problem, Worsening (2), New Problem, with no additional work-up planned (3), Review of Last Therapy Session (1) and Review of Medication Regimen & Side Effects (2)  Treatment Plan Summary: Plan Patient with a history of schizophrenia who has been off medicine for months presents to the hospital with worsening hallucinations. He is not reporting suicidal ideation but does feel disturbed by them. Mood is been worse. Functioning worse. Not currently involved in any outpatient treatment. Although he is not under commitment I think he would benefit and qualifies for inpatient hospitalization because of psychosis. Plan is to admit him to the psychiatry ward. Restart low-dose Haldol as that was what we used  last time. Primary treatment team can work with him on any changes to medication or treatment plan. Patient agreeable.  Plan:  Recommend psychiatric Inpatient admission when medically cleared. Supportive therapy provided about ongoing stressors. Discussed crisis plan, support from social network, calling 911, coming to the Emergency Department, and calling Suicide Hotline. Disposition: Admitted to psychiatry  Alethia Berthold 02/14/2015 5:38 PM

## 2015-02-14 NOTE — ED Notes (Signed)
BEHAVIORAL HEALTH ROUNDING Patient sleeping: No. Patient alert and oriented: yes Behavior appropriate: Yes.   Nutrition and fluids offered: Yes  Toileting and hygiene offered: Yes  Sitter present: yes 15 min checks  Law enforcement present: Yes  

## 2015-02-14 NOTE — ED Notes (Signed)
BEHAVIORAL HEALTH ROUNDING Patient sleeping: Yes.   Patient alert and oriented: sleeping Behavior appropriate: Yes.  ; If no, describe:  Nutrition and fluids offered: No Toileting and hygiene offered: No Sitter present: yes Law enforcement present: Yes  

## 2015-02-14 NOTE — BH Assessment (Addendum)
Assessment Note  Justin BostonCarlos E Schaefer is an 24 y.o. male Who presents to the ER due to hearing voices and being unable to sleep. Pt. reports he was taking medications in the past but stop taking them. He was unable to share when he stopped taking them. He states the voices have increased and is worsening. He is losing sleep and becoming more depressed due to it. He is currently living with his brother and his family is aware of his situation. Pt. denies SI at this time but states, "I can't keep living like this.  He is a poor historian and isn't clear on the details of past mental health treatment.   Axis I: Schizoaffective Disorder Axis III: History reviewed. No pertinent past medical history. Axis IV: economic problems, educational problems, occupational problems, other psychosocial or environmental problems, problems related to social environment and problems with primary support group  Past Medical History: History reviewed. No pertinent past medical history.  History reviewed. No pertinent past surgical history.  Family History: No family history on file.  Social History:  reports that he has never smoked. He does not have any smokeless tobacco history on file. He reports that he does not drink alcohol. His drug history is not on file.  Additional Social History:  Alcohol / Drug Use Pain Medications: None reported Prescriptions: None reported Over the Counter: None reported History of alcohol / drug use?: No history of alcohol / drug abuse Longest period of sobriety (when/how long): None reported Withdrawal Symptoms:  (None reported)  CIWA: CIWA-Ar BP: 113/76 mmHg Pulse Rate: 67 COWS:    Allergies: No Known Allergies  Home Medications:  (Not in a hospital admission)  OB/GYN Status:  No LMP for male patient.  General Assessment Data [facility] Assessment: In system Is this a Tele or Face-to-Face Assessment?: Face-to-Face Is this an Initial Assessment  or a Re-assessment for this encounter?: Initial Assessment Marital status: Single Is patient pregnant?: No Pregnancy Status: No Living Arrangements:  (Brother) Can pt return to current living arrangement?: Yes Admission Status: Voluntary Is patient capable of signing voluntary admission?: Yes Referral Source: Self/Family/Friend  Medical Screening Exam [facility] Walk-in ONLY) Medical Exam completed: Yes  Crisis Care Plan Living Arrangements:  (Brother) Name of Psychiatrist: Dr. Toni Amendlapacs  Education Status Is patient currently in school?: No Highest grade of school patient has completed: Unknown  Risk to self with the past 6 months Suicidal Ideation: No Has patient been a risk to self within the past 6 months prior to admission? : No Suicidal Intent: No Has patient had any suicidal intent within the past 6 months prior to admission? : No Is patient at risk for suicide?: No Suicidal Plan?: No Has patient had any suicidal plan within the past 6 months prior to admission? : No Access to Means: No What has been your use of drugs/alcohol within the last 12 months?: None reported Previous Attempts/Gestures: No How many times?: 0 Other Self Harm Risks: None reported Triggers for Past Attempts: None known Intentional Self Injurious Behavior: None Family Suicide History: Unknown Recent stressful life event(s):  (Increase A/H) Persecutory voices/beliefs?: Yes Depression: Yes Depression Symptoms: Fatigue, Feeling angry/irritable, Feeling worthless/self pity Substance abuse history and/or treatment for substance abuse?: No Suicide prevention information given to non-admitted patients: Not applicable  Risk to Others within the past 6 months Homicidal Ideation: No Thoughts of Harm to Others: No Current Homicidal Intent: No Current Homicidal Plan: No Access to Homicidal Means: No Identified Victim:  None reported History of harm to others?: No Assessment of Violence: None Noted Violent  Behavior Description: None reported Does patient have access to weapons?: No Criminal Charges Pending?: No Does patient have a court date: No Is patient on probation?: No  Psychosis Hallucinations: Auditory, With command Delusions: None noted  Mental Status Report Appearance/Hygiene: Disheveled Eye Contact: Poor Motor Activity: Freedom of movement, Unremarkable Speech: Pressured Level of Consciousness: Alert Mood: Depressed, Anxious, Fearful, Helpless, Sad Affect: Anxious, Depressed, Appropriate to circumstance Anxiety Level: Moderate Thought Processes: Coherent, Relevant Judgement: Impaired Orientation: Person, Place, Situation Obsessive Compulsive Thoughts/Behaviors: Moderate  Cognitive Functioning Concentration: Decreased Memory: Recent Intact, Remote Impaired IQ: Above Average Insight: Poor Impulse Control: Poor Appetite: Fair Weight Loss: 0 Weight Gain: 0 Sleep: Decreased Total Hours of Sleep: 3 Vegetative Symptoms: None  ADLScreening [facility] Assessment Services) Patient's cognitive ability adequate to safely complete daily activities?: Yes Patient able to express need for assistance with ADLs?: Yes Independently performs ADLs?: Yes (appropriate for developmental age)  Prior Inpatient Therapy Prior Inpatient Therapy: Yes Prior Therapy Dates: 02/2014 [facility] Reason for Treatment: A/H  Prior Outpatient Therapy Prior Outpatient Therapy: No (Pt. didn't follow up w/referrals) Does patient have an ACCT team?: No Does patient have Intensive In-House Services?  : No Does patient have [facility] services? : No Does patient have P4CC services?: No  ADL Screening (condition at time of admission) Patient's cognitive ability adequate to safely complete daily activities?: Yes Patient able to express need for assistance with ADLs?: Yes Independently performs ADLs?: Yes (appropriate for developmental age)       Abuse/Neglect Assessment  (Assessment to be complete while patient is alone) Physical Abuse: Denies Verbal Abuse: Denies Sexual Abuse: Denies Exploitation of patient/patient's resources: Denies Self-Neglect: Denies Values / Beliefs Cultural Requests During Hospitalization: None Spiritual Requests During Hospitalization: None Consults Spiritual Care Consult Needed: No Social Work Consult Needed: No Merchant navy officerAdvance Directives (For Healthcare) Does patient have an advance directive?: No Would patient like information on creating an advanced directive?: No - patient declined information    Additional Information 1:1 In Past 12 Months?: No CIRT Risk: No Elopement Risk: No Does patient have medical clearance?: Yes  Child/Adolescent Assessment Running Away Risk: Denies (Pt. is an adult)  Disposition:  Disposition Initial Assessment Completed for this Encounter: Yes Disposition of Patient: Inpatient treatment program, Other dispositions Type of inpatient treatment program: Adult Other disposition(s): Other (Comment) (Psych MD to see)  On Site Evaluation by:   Reviewed with Physician:    Lilyan Gilfordalvin J. Norrine Ballester, MS, LCAS, LPC, NCC, CCSI 02/14/2015 6:47 PM

## 2015-02-14 NOTE — ED Notes (Signed)
[facility]. Pt transferred to ED BHU room 3. Pt care assumed. Pt oriented to unit and advised that cameras are present in every room of unit for pt safety. Pt verbalizes understanding. Pt has no needs or concerns at this time.

## 2015-02-14 NOTE — ED Notes (Signed)
BEHAVIORAL HEALTH ROUNDING Patient sleeping: No. Patient alert and oriented: yes Behavior appropriate: Yes.  ; If no, describe:  Nutrition and fluids offered: Yes  Toileting and hygiene offered: Yes  Sitter present: no Law enforcement present: Yes, ODS 

## 2015-02-14 NOTE — ED Notes (Signed)

## 2015-02-14 NOTE — ED Provider Notes (Signed)
-----------------------------------------   6:12 PM on 02/14/2015 -----------------------------------------   BP 113/76 mmHg  Pulse 67  Temp(Src) 97.6 F (36.4 C) (Oral)  Ht 5\' 7"  (1.702 m)  Wt 140 lb (63.504 kg)  BMI 21.92 kg/m2  SpO2 95%  The patient had no acute events since last update.  Calm and cooperative at this time.  Disposition is pending per Psychiatry/Behavioral Medicine team recommendations.    Arnaldo NatalPaul F Charlize Hathaway, MD 02/14/15 [phone]

## 2015-02-14 NOTE — ED Notes (Signed)
Breakfast Trays passed out to pt's at this time. No acute distress noted.  

## 2015-02-14 NOTE — ED Notes (Signed)
Pt. Walked over to [employer].

## 2015-02-14 NOTE — ED Notes (Signed)
Pt. Reports concerned about swelling on right side of face. Denies pain, feels like something is stuck in his right cheek. Dr. Darnelle CatalanMalinda is aware.

## 2015-02-14 NOTE — ED Notes (Addendum)
BEHAVIORAL HEALTH ROUNDING Patient sleeping: Yes.   Patient alert and oriented: not applicable Behavior appropriate: Yes.  ; If no, describe:  Nutrition and fluids offered: No. Pt. Is sleeping. Toileting and hygiene offered: No Sitter present: yes Law enforcement present: Yes  

## 2015-02-14 NOTE — ED Provider Notes (Signed)
C patient who said he had told the nurse he had some swelling in his face on exam patient does seem to have a somewhat asymmetric looking face but on palpation there is no apparent firmness no masses mouth opens well the inside the mouth looks normal there is no sign of any pus at Stensen's duct or anything else teeth look normal gums also looked normal patient reports she's not having any pain and does not feel any masses at the present time and will continue to follow him  Arnaldo NatalPaul F Shalicia Craghead, MD 02/14/15 1712

## 2015-02-14 NOTE — ED Notes (Signed)
BEHAVIORAL HEALTH ROUNDING Patient sleeping: Yes.   Patient alert and oriented: not applicable Behavior appropriate: Yes.  ; If no, describe:  Nutrition and fluids offered: No Toileting and hygiene offered: No Sitter present: yes Law enforcement present: Yes   

## 2015-02-15 ENCOUNTER — Inpatient Hospital Stay
Admission: AD | Admit: 2015-02-15 | Discharge: 2015-02-21 | DRG: 885 | Disposition: A | Discharge status: Home / Self Care | Payer: Medicaid Other | Source: Intra-hospital | Attending: Psychiatry | Admitting: Psychiatry

## 2015-02-15 ENCOUNTER — Encounter: Payer: Self-pay | Admitting: *Deleted

## 2015-02-15 DIAGNOSIS — F209 Schizophrenia, unspecified: Principal | ICD-10-CM | POA: Diagnosis present

## 2015-02-15 DIAGNOSIS — Z658 Other specified problems related to psychosocial circumstances: Secondary | ICD-10-CM | POA: Diagnosis not present

## 2015-02-15 DIAGNOSIS — F1290 Cannabis use, unspecified, uncomplicated: Secondary | ICD-10-CM | POA: Diagnosis present

## 2015-02-15 DIAGNOSIS — F1210 Cannabis abuse, uncomplicated: Secondary | ICD-10-CM | POA: Diagnosis present

## 2015-02-15 DIAGNOSIS — Z9621 Cochlear implant status: Secondary | ICD-10-CM | POA: Diagnosis present

## 2015-02-15 DIAGNOSIS — G4700 Insomnia, unspecified: Secondary | ICD-10-CM | POA: Diagnosis present

## 2015-02-15 DIAGNOSIS — F17210 Nicotine dependence, cigarettes, uncomplicated: Secondary | ICD-10-CM | POA: Diagnosis present

## 2015-02-15 DIAGNOSIS — H9190 Unspecified hearing loss, unspecified ear: Secondary | ICD-10-CM | POA: Diagnosis present

## 2015-02-15 DIAGNOSIS — F203 Undifferentiated schizophrenia: Secondary | ICD-10-CM | POA: Insufficient documentation

## 2015-02-15 DIAGNOSIS — F17200 Nicotine dependence, unspecified, uncomplicated: Secondary | ICD-10-CM | POA: Diagnosis present

## 2015-02-15 DIAGNOSIS — Z9119 Patient's noncompliance with other medical treatment and regimen: Secondary | ICD-10-CM | POA: Diagnosis present

## 2015-02-15 HISTORY — DX: Cochlear implant status: Z96.21

## 2015-02-15 MED ORDER — FLUOXETINE HCL 20 MG PO CAPS
20.0000 mg | ORAL_CAPSULE | Freq: Every day | ORAL | Status: DC
  Administered 2015-02-15 – 2015-02-19 (×5): 20 mg via ORAL
  Filled 2015-02-15 (×6): qty 1

## 2015-02-15 MED ORDER — NICOTINE 10 MG IN INHA
1.0000 | RESPIRATORY_TRACT | Status: DC | PRN
  Administered 2015-02-16 – 2015-02-19 (×5): 1 via RESPIRATORY_TRACT

## 2015-02-15 MED ORDER — TEMAZEPAM 15 MG PO CAPS: 15.0000 mg | ORAL_CAPSULE | Freq: Every evening | ORAL | Status: DC | PRN

## 2015-02-15 MED ORDER — PNEUMOCOCCAL VAC POLYVALENT 25 MCG/0.5ML IJ INJ
0.5000 mL | INJECTION | INTRAMUSCULAR | Status: DC
  Filled 2015-02-15 (×2): qty 0.5

## 2015-02-15 MED ORDER — ARIPIPRAZOLE 10 MG PO TABS
10.0000 mg | ORAL_TABLET | Freq: Once | ORAL | Status: AC
  Administered 2015-02-15: 10 mg via ORAL
  Filled 2015-02-15: qty 1

## 2015-02-15 NOTE — ED Notes (Signed)

## 2015-02-15 NOTE — ED Notes (Signed)
ED BHU PLACEMENT JUSTIFICATION Is the patient under IVC or is there intent for IVC: Yes.   Is the patient medically cleared: Yes.   Is there vacancy in the ED BHU: Yes.   Is the population mix appropriate for patient: Yes.   Is the patient awaiting placement in inpatient or outpatient setting: Yes.   Has the patient had a psychiatric consult: Yes.   Survey of unit performed for contraband, proper placement and condition of furniture, tampering with fixtures in bathroom, shower, and each patient room: Yes.  ; Findings:    APPEARANCE/BEHAVIOR calm, cooperative and adequate rapport can be established NEURO ASSESSMENT Orientation: time, place and person Hallucinations: No.None noted (Hallucinations) Speech: Normal and Rate:soft Gait: normal RESPIRATORY ASSESSMENT Normal expansion.  Clear to auscultation.  No rales, rhonchi, or wheezing. CARDIOVASCULAR ASSESSMENT regular rate and rhythm, S1, S2 normal, no murmur, click, rub or gallop GASTROINTESTINAL ASSESSMENT soft, nontender, BS WNL, no r/g EXTREMITIES normal strength, tone, and muscle mass PLAN OF CARE Provide calm/safe environment. Vital signs assessed twice daily. ED BHU Assessment once each 12-hour shift. Collaborate with intake RN daily or as condition indicates. Assure the ED provider has rounded once each shift. Provide and encourage hygiene. Provide redirection as needed. Assess for escalating behavior; address immediately and inform ED provider.  Assess family dynamic and appropriateness for visitation as needed: No.; If necessary, describe findings: Family has not visited during this shift. Educate the patient/family about BHU procedures/visitation: Yes.  ; If necessary, describe findings:  Patient is aware.

## 2015-02-15 NOTE — ED Notes (Signed)
BEHAVIORAL HEALTH ROUNDING Patient sleeping: Yes.   Patient alert and oriented: asleep Behavior appropriate: Yes.  ; If no, describe:  Nutrition and fluids offered: Yes  Toileting and hygiene offered: Yes  Sitter present: no Law enforcement present: Yes, ODS 

## 2015-02-15 NOTE — H&P (Signed)
Psychiatric Admission Assessment Adult  Patient Identification: Justin Schaefer MRN:  [id number] Date of Evaluation:  02/15/2015 Chief Complaint:  schizophrenia Principal Diagnosis: Schizophrenia Diagnosis:   Patient Active Problem List   Diagnosis Date Noted  . Schizophrenia [F20.9] 02/14/2015    Priority: High  . Tobacco use disorder [Z72.0] 02/15/2015    Priority: Medium  . Cannabis use disorder severe. [F12.10] 02/15/2015    Priority: Medium   History of Present Illness::   Identifying data. Justin Schaefer is a 24 year old male with history of psychosis.  Chief complaint. "I heard voices."  History of present illness. Justin Schaefer has a history of psychosis. He has been hearing voices for over a year now. He was admitted to the hospital a year ago and started on Risperdal. He took it briefly. He never had resolutions of auditory hallucinations. He did not like the way the Risperdal made him feel. He did not follow-up with outpatient providers. For the past 2 weeks or so, at the patient has been in distress due to voices growing louder. He is unable to tell me the content of his hallucinations. It is unclear whether he has commands. He hears a "funny" voice but is unable to explain why this is distressing. During my interview he clearly is listening to his voices. He reports depressed mood with poor sleep, decreased appetite, anhedonia, feeling of guilt and hopelessness worthlessness, social isolation, crying spells, anhedonia, and heightened anxiety. He denies suicidal ideation. His insomnia has been very severe and in the past several days he did not sleep at all. He denies symptoms suggestive of bipolar mania. He denies alcohol or illicit substance use but is positive for cannabinoids on admission.  Past psychiatric history. One prior hospitalization for psychosis. Treated with Risperdal with no improvement. He has no provider in the community. There were no suicide attempts.  Family  psychiatric history. None reported.  ETotal Time spent with patient: 1 hour  Past Medical History:  Past Medical History  Diagnosis Date  . 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 Cochlear implant     Family History: History reviewed. No pertinent family history. Social History:  History  Alcohol Use No     History  Drug Use  . Yes  . Special: Marijuana    History   Social History  . Marital Status: Single    Spouse Name: N/A  . Number of Children: N/A  . Years of Education: N/A   Social History Main Topics  . Smoking status: Current Every Day Smoker -- 0.25 packs/day    Types: Cigarettes  . Smokeless tobacco: Not on file  . Alcohol Use: No  . Drug Use: Yes    Special: Marijuana  . Sexual Activity: No   Other Topics Concern  . None   Social History Narrative   Additional Social History: The patient graduated from high school. 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 He lives with his brother. The brother works most of the time. The patient has no friends or family. He is very isolated.    History of alcohol / drug use?: Yes Name of Substance 1: thc 1 - Age of First Use: unsure of age 25 - Frequency: occasionally  1 - Last Use / Amount: usnure of last use                   Musculoskeletal: Strength & Muscle Tone: within normal  limits Gait & Station: normal Patient leans: N/A  Psychiatric Specialty Exam: Physical Exam  Nursing note and vitals reviewed.   Review of Systems  All other systems reviewed and are negative.   Blood pressure 113/68, pulse 48, temperature 98.1 F (36.7 C), temperature source Oral, resp. rate 18, height _0  (1.702 m), weight 58.968 kg (130 lb), SpO2 99 %.Body mass index is 20.36 kg/(m^2).  See SRA                                                  Sleep:  Number of Hours: 2   Risk to Self: Is patient at risk for suicide?:  Yes Risk to Others:   Prior Inpatient Therapy:   Prior Outpatient Therapy:    Alcohol Screening: 1. How often do you have a drink containing alcohol?: Never 9. Have you or someone else been injured as a result of your drinking?: No 10. Has a relative or friend or a doctor or another health worker been concerned about your drinking or suggested you cut down?: No Alcohol Use Disorder Identification Test Final Score (AUDIT): 0 Brief Intervention: AUDIT score less than 7 or less-screening does not suggest unhealthy drinking-brief intervention not indicated  Allergies:  No Known Allergies Lab Results:  Results for orders placed or performed during the hospital encounter of 02/13/15 (from the past 48 hour(s))  CBC WITH DIFFERENTIAL     Status: None   Collection Time: 02/13/15  5:44 PM  Result Value Ref Range   WBC 7.6 3.8 - 10.6 K/uL   RBC 5.80 4.40 - 5.90 MIL/uL   Hemoglobin 16.6 13.0 - 18.0 g/dL   HCT 50.1 40.0 - 52.0 %   MCV 86.5 80.0 - 100.0 fL   MCH 28.7 26.0 - 34.0 pg   MCHC 33.1 32.0 - 36.0 g/dL   RDW 13.2 11.5 - 14.5 %   Platelets 173 150 - 440 K/uL   Neutrophils Relative % 51 %   Neutro Abs 3.9 1.4 - 6.5 K/uL   Lymphocytes Relative 35 %   Lymphs Abs 2.6 1.0 - 3.6 K/uL   Monocytes Relative 10 %   Monocytes Absolute 0.7 0.2 - 1.0 K/uL   Eosinophils Relative 3 %   Eosinophils Absolute 0.2 0 - 0.7 K/uL   Basophils Relative 1 %   Basophils Absolute 0.0 0 - 0.1 K/uL  Comprehensive metabolic panel     Status: None   Collection Time: 02/13/15  5:44 PM  Result Value Ref Range   Sodium 140 135 - 145 mmol/L   Potassium 4.0 3.5 - 5.1 mmol/L   Chloride 102 101 - 111 mmol/L   CO2 30 22 - 32 mmol/L   Glucose, Bld 99 65 - 99 mg/dL   BUN 13 6 - 20 mg/dL   Creatinine, Ser 0.87 0.61 - 1.24 mg/dL   Calcium 9.4 8.9 - 10.3 mg/dL   Total Protein 7.3 6.5 - 8.1 g/dL   Albumin 4.5 3.5 - 5.0 g/dL   AST 20 15 - 41 U/L   ALT 17 17 - 63 U/L   Alkaline Phosphatase 57 38 - 126 U/L   Total  Bilirubin 0.6 0.3 - 1.2 mg/dL   GFR calc non Af Amer >60 >60 mL/min   GFR calc Af Amer >60 >60 mL/min    Comment: (NOTE) The eGFR has been  calculated using the CKD EPI equation. This calculation has not been validated in all clinical situations. eGFR's persistently <60 mL/min signify possible Chronic Kidney Disease.    Anion gap 8 5 - 15  Ethanol     Status: None   Collection Time: 02/13/15  5:44 PM  Result Value Ref Range   Alcohol, Ethyl (B) <5 <5 mg/dL    Comment:        LOWEST DETECTABLE LIMIT FOR SERUM ALCOHOL IS 11 mg/dL FOR MEDICAL PURPOSES ONLY   Urinalysis complete, with microscopic [facility])     Status: Abnormal   Collection Time: 02/13/15  5:44 PM  Result Value Ref Range   Color, Urine STRAW (A) YELLOW   APPearance CLEAR (A) CLEAR   Glucose, UA NEGATIVE NEGATIVE mg/dL   Bilirubin Urine NEGATIVE NEGATIVE   Ketones, ur NEGATIVE NEGATIVE mg/dL   Specific Gravity, Urine 1.006 1.005 - 1.030   Hgb urine dipstick NEGATIVE NEGATIVE   pH 7.0 5.0 - 8.0   Protein, ur NEGATIVE NEGATIVE mg/dL   Nitrite NEGATIVE NEGATIVE   Leukocytes, UA NEGATIVE NEGATIVE   RBC / HPF 0-5 0 - 5 RBC/hpf   WBC, UA NONE SEEN 0 - 5 WBC/hpf   Bacteria, UA NONE SEEN NONE SEEN   Squamous Epithelial / LPF NONE SEEN NONE SEEN  Urine Drug Screen, Qualitative [facility])     Status: Abnormal   Collection Time: 02/13/15  5:44 PM  Result Value Ref Range   Tricyclic, Ur Screen NONE DETECTED NONE DETECTED   Amphetamines, Ur Screen NONE DETECTED NONE DETECTED   MDMA (Ecstasy)Ur Screen NONE DETECTED NONE DETECTED   Cocaine Metabolite,Ur [place] NONE DETECTED NONE DETECTED   Opiate, Ur Screen NONE DETECTED NONE DETECTED   Phencyclidine (PCP) Ur S NONE DETECTED NONE DETECTED   Cannabinoid 50 Ng, Ur [place] POSITIVE (A) NONE DETECTED   Barbiturates, Ur Screen NONE DETECTED NONE DETECTED   Benzodiazepine, Ur Scrn NONE DETECTED NONE DETECTED   Methadone Scn, Ur NONE DETECTED NONE DETECTED    Comment: (NOTE) 277  Tricyclics,  urine               Cutoff 1000 ng/mL 200  Amphetamines, urine             Cutoff 1000 ng/mL 300  MDMA (Ecstasy), urine           Cutoff 500 ng/mL 400  Cocaine Metabolite, urine       Cutoff 300 ng/mL 500  Opiate, urine                   Cutoff 300 ng/mL 600  Phencyclidine (PCP), urine      Cutoff 25 ng/mL 700  Cannabinoid, urine              Cutoff 50 ng/mL 800  Barbiturates, urine             Cutoff 200 ng/mL 900  Benzodiazepine, urine           Cutoff 200 ng/mL 1000 Methadone, urine                Cutoff 300 ng/mL 1100 1200 The urine drug screen provides only a preliminary, unconfirmed 1300 analytical test result and should not be used for non-medical 1400 purposes. Clinical consideration and professional judgment should 1500 be applied to any positive drug screen result due to possible 1600 interfering substances. A more specific alternate chemical method 1700 must be used in order to obtain a confirmed analytical result.  1800 Gas chromato graphy / mass spectrometry (GC/MS) is the preferred 1900 confirmatory method.   Acetaminophen level     Status: Abnormal   Collection Time: 02/13/15  5:44 PM  Result Value Ref Range   Acetaminophen (Tylenol), Serum <10 (L) 10 - 30 ug/mL    Comment:        THERAPEUTIC CONCENTRATIONS VARY SIGNIFICANTLY. A RANGE OF 10-30 ug/mL MAY BE AN EFFECTIVE CONCENTRATION FOR MANY PATIENTS. HOWEVER, SOME ARE BEST TREATED AT CONCENTRATIONS OUTSIDE THIS RANGE. ACETAMINOPHEN CONCENTRATIONS >150 ug/mL AT 4 HOURS AFTER INGESTION AND >50 ug/mL AT 12 HOURS AFTER INGESTION ARE OFTEN ASSOCIATED WITH TOXIC REACTIONS.   Salicylate level     Status: None   Collection Time: 02/13/15  5:44 PM  Result Value Ref Range   Salicylate Lvl <7.6 2.8 - 30.0 mg/dL   Current Medications: Current Facility-Administered Medications  Medication Dose Route Frequency Provider Last Rate Last Dose  . ARIPiprazole (ABILIFY) tablet 10 mg  10 mg Oral Once Orlan Aversa B Rehman Levinson,  MD      . FLUoxetine (PROZAC) capsule 20 mg  20 mg Oral QHS Iyonna Rish B Brehanna Deveny, MD      . nicotine (NICOTROL) 10 MG inhaler 1 continuous puffing  1 continuous puffing Inhalation PRN Clovis Fredrickson, MD      . Derrill Memo ON 02/16/2015] pneumococcal 23 valent vaccine (PNU-IMMUNE) injection 0.5 mL  0.5 mL Intramuscular Tomorrow-1000 John T Clapacs, MD      . temazepam (RESTORIL) capsule 15 mg  15 mg Oral QHS PRN Tyiana Hill B Dwight Adamczak, MD       PTA Medications: No prescriptions prior to admission    Previous Psychotropic Medications: Yes   Substance Abuse History in the last 12 months:  Yes.      Consequences of Substance Abuse: Medical Consequences:  worsening of psychosis.  Results for orders placed or performed during the hospital encounter of 02/13/15 (from the past 72 hour(s))  CBC WITH DIFFERENTIAL     Status: None   Collection Time: 02/13/15  5:44 PM  Result Value Ref Range   WBC 7.6 3.8 - 10.6 K/uL   RBC 5.80 4.40 - 5.90 MIL/uL   Hemoglobin 16.6 13.0 - 18.0 g/dL   HCT 50.1 40.0 - 52.0 %   MCV 86.5 80.0 - 100.0 fL   MCH 28.7 26.0 - 34.0 pg   MCHC 33.1 32.0 - 36.0 g/dL   RDW 13.2 11.5 - 14.5 %   Platelets 173 150 - 440 K/uL   Neutrophils Relative % 51 %   Neutro Abs 3.9 1.4 - 6.5 K/uL   Lymphocytes Relative 35 %   Lymphs Abs 2.6 1.0 - 3.6 K/uL   Monocytes Relative 10 %   Monocytes Absolute 0.7 0.2 - 1.0 K/uL   Eosinophils Relative 3 %   Eosinophils Absolute 0.2 0 - 0.7 K/uL   Basophils Relative 1 %   Basophils Absolute 0.0 0 - 0.1 K/uL  Comprehensive metabolic panel     Status: None   Collection Time: 02/13/15  5:44 PM  Result Value Ref Range   Sodium 140 135 - 145 mmol/L   Potassium 4.0 3.5 - 5.1 mmol/L   Chloride 102 101 - 111 mmol/L   CO2 30 22 - 32 mmol/L   Glucose, Bld 99 65 - 99 mg/dL   BUN 13 6 - 20 mg/dL   Creatinine, Ser 0.87 0.61 - 1.24 mg/dL   Calcium 9.4 8.9 - 10.3 mg/dL   Total Protein 7.3 6.5 - 8.1 g/dL  Albumin 4.5 3.5 - 5.0 g/dL   AST 20 15 -  41 U/L   ALT 17 17 - 63 U/L   Alkaline Phosphatase 57 38 - 126 U/L   Total Bilirubin 0.6 0.3 - 1.2 mg/dL   GFR calc non Af Amer >60 >60 mL/min   GFR calc Af Amer >60 >60 mL/min    Comment: (NOTE) The eGFR has been calculated using the CKD EPI equation. This calculation has not been validated in all clinical situations. eGFR's persistently <60 mL/min signify possible Chronic Kidney Disease.    Anion gap 8 5 - 15  Ethanol     Status: None   Collection Time: 02/13/15  5:44 PM  Result Value Ref Range   Alcohol, Ethyl (B) <5 <5 mg/dL    Comment:        LOWEST DETECTABLE LIMIT FOR SERUM ALCOHOL IS 11 mg/dL FOR MEDICAL PURPOSES ONLY   Urinalysis complete, with microscopic [facility])     Status: Abnormal   Collection Time: 02/13/15  5:44 PM  Result Value Ref Range   Color, Urine STRAW (A) YELLOW   APPearance CLEAR (A) CLEAR   Glucose, UA NEGATIVE NEGATIVE mg/dL   Bilirubin Urine NEGATIVE NEGATIVE   Ketones, ur NEGATIVE NEGATIVE mg/dL   Specific Gravity, Urine 1.006 1.005 - 1.030   Hgb urine dipstick NEGATIVE NEGATIVE   pH 7.0 5.0 - 8.0   Protein, ur NEGATIVE NEGATIVE mg/dL   Nitrite NEGATIVE NEGATIVE   Leukocytes, UA NEGATIVE NEGATIVE   RBC / HPF 0-5 0 - 5 RBC/hpf   WBC, UA NONE SEEN 0 - 5 WBC/hpf   Bacteria, UA NONE SEEN NONE SEEN   Squamous Epithelial / LPF NONE SEEN NONE SEEN  Urine Drug Screen, Qualitative [facility])     Status: Abnormal   Collection Time: 02/13/15  5:44 PM  Result Value Ref Range   Tricyclic, Ur Screen NONE DETECTED NONE DETECTED   Amphetamines, Ur Screen NONE DETECTED NONE DETECTED   MDMA (Ecstasy)Ur Screen NONE DETECTED NONE DETECTED   Cocaine Metabolite,Ur [place] NONE DETECTED NONE DETECTED   Opiate, Ur Screen NONE DETECTED NONE DETECTED   Phencyclidine (PCP) Ur S NONE DETECTED NONE DETECTED   Cannabinoid 50 Ng, Ur [place] POSITIVE (A) NONE DETECTED   Barbiturates, Ur Screen NONE DETECTED NONE DETECTED   Benzodiazepine, Ur Scrn NONE DETECTED NONE DETECTED    Methadone Scn, Ur NONE DETECTED NONE DETECTED    Comment: (NOTE) 893  Tricyclics, urine               Cutoff 1000 ng/mL 200  Amphetamines, urine             Cutoff 1000 ng/mL 300  MDMA (Ecstasy), urine           Cutoff 500 ng/mL 400  Cocaine Metabolite, urine       Cutoff 300 ng/mL 500  Opiate, urine                   Cutoff 300 ng/mL 600  Phencyclidine (PCP), urine      Cutoff 25 ng/mL 700  Cannabinoid, urine              Cutoff 50 ng/mL 800  Barbiturates, urine             Cutoff 200 ng/mL 900  Benzodiazepine, urine           Cutoff 200 ng/mL 1000 Methadone, urine  Cutoff 300 ng/mL 1100 1200 The urine drug screen provides only a preliminary, unconfirmed 1300 analytical test result and should not be used for non-medical 1400 purposes. Clinical consideration and professional judgment should 1500 be applied to any positive drug screen result due to possible 1600 interfering substances. A more specific alternate chemical method 1700 must be used in order to obtain a confirmed analytical result.  1800 Gas chromato graphy / mass spectrometry (GC/MS) is the preferred 1900 confirmatory method.   Acetaminophen level     Status: Abnormal   Collection Time: 02/13/15  5:44 PM  Result Value Ref Range   Acetaminophen (Tylenol), Serum <10 (L) 10 - 30 ug/mL    Comment:        THERAPEUTIC CONCENTRATIONS VARY SIGNIFICANTLY. A RANGE OF 10-30 ug/mL MAY BE AN EFFECTIVE CONCENTRATION FOR MANY PATIENTS. HOWEVER, SOME ARE BEST TREATED AT CONCENTRATIONS OUTSIDE THIS RANGE. ACETAMINOPHEN CONCENTRATIONS >150 ug/mL AT 4 HOURS AFTER INGESTION AND >50 ug/mL AT 12 HOURS AFTER INGESTION ARE OFTEN ASSOCIATED WITH TOXIC REACTIONS.   Salicylate level     Status: None   Collection Time: 02/13/15  5:44 PM  Result Value Ref Range   Salicylate Lvl <7.9 2.8 - 30.0 mg/dL    Observation Level/Precautions:  15 minute checks  Laboratory:  CBC Chemistry Profile UDS UA  Psychotherapy:     Medications:    Consultations:    Discharge Concerns:    Estimated LOS:  Other:     Psychological Evaluations: No   Treatment Plan Summary: Daily contact with patient to assess and evaluate symptoms and progress in treatment and Medication management  Medical Decision Making:  New problem, with additional work up planned, Review of Psycho-Social Stressors (1), Review or order clinical lab tests (1), Review of Medication Regimen & Side Effects (2) and Review of New Medication or Change in Dosage (2)   Mr. Lamagna is a 24 year old male with a history of psychosis admitted for worsening of hallucinations in the context of medication noncompliance and substance use.  1. Psychosis. He has been treated with Risperdal in the past. Did not feel it was helpful. The voices never resolved. He agreed to try Abilify 10 mg daily. He is open to injectable Abilify maintenance. Risks benefits and side effects of antipsychotic were discussed with the patient.  2. Insomnia. The patient has not been sleeping for weeks. Last night he slept 2 hours only. Will prescribe Restoril 15 mg as needed.  3. Mood. The patient endorses many symptoms of depression and he is isolated at home rather unhappy. He agrees to start Prozac for depression. He also reports symptoms of anxiety social type.  4. Smoking. Nicotine products are available.  5. Cannabis use. He is a regular user. He minimizes the problem and is not interested in substance abuse treatment..  6. Disposition. He will return to home with his brother. He will follow up with local provider [facility].  I certify that inpatient services furnished can reasonably be expected to improve the patient's condition.   Orson Slick 5/13/201611:35 AM

## 2015-02-15 NOTE — Progress Notes (Signed)
Pt was pleasant but slightly anxious during the adm process. During most of the process pt held his head down in his hands. Writer found it difficult to assess the pt due to him shrugging his shoulders to answer questions. For most of the questions pt shrugged to mean he didn't know the answers to the questions. Pt had to be asked questions several times before answering questions or shrugging shoulders. Pt denied suicidal thoughts but still has a//H. 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 Pt reported that his head was itching, however pt's skin assessment by male RN didn't show any abnormalities. Informed pt to wash his hair. Pt did get in the shower after adm. Pt denied SI, HI.

## 2015-02-15 NOTE — ED Notes (Signed)
Pt resting in bed with eyes closed. No unusual behavior observed. Pt has no needs or concerns at this time. Will continue to monitor and f/u as needed.  

## 2015-02-15 NOTE — [facility] Suicide Risk Assessment (Signed)
[facility]   Nursing information obtained from:  Patient Demographic factors:  Male, Adolescent or young adult, Low socioeconomic status, Unemployed Current Mental Status:  NA Loss Factors:  NA Historical Factors:  NA Risk Reduction Factors:  Living with another person, especially a relative Total Time spent with patient: 1 hour Principal Problem: Schizophrenia Diagnosis:   Patient Active Problem List   Diagnosis Date Noted  . Schizophrenia [F20.9] 02/14/2015    Priority: High  . Tobacco use disorder [Z72.0] 02/15/2015    Priority: Medium     Continued Clinical Symptoms:  Alcohol Use Disorder Identification Test Final Score (AUDIT): 0 The "Alcohol Use Disorders Identification Test", Guidelines for Use in Primary Care, Second Edition.  World Science writerHealth Organization [facility](WHO). Score between 0-7:  no or low risk or alcohol related problems. Score between 8-15:  moderate risk of alcohol related problems. Score between 16-19:  high risk of alcohol related problems. Score 20 or above:  warrants further diagnostic evaluation for alcohol dependence and treatment.   CLINICAL FACTORS:   Schizophrenia:   Command hallucinatons   Musculoskeletal: Strength & Muscle Tone: within normal limits Gait & Station: normal Patient leans: N/A  Psychiatric Specialty Exam: Physical Exam  Nursing note and vitals reviewed. Constitutional: He is oriented to person, place, and time. He appears well-developed and well-nourished.  HENT:  Head: Atraumatic.  Eyes: Conjunctivae and EOM are normal. Pupils are equal, round, and reactive to light.  Neck: Normal range of motion. Neck supple. No thyromegaly present.  Cardiovascular: Normal rate, regular rhythm and normal heart sounds.   Respiratory: Effort normal and breath sounds normal.  GI: Soft. Bowel sounds are normal.  Musculoskeletal: Normal range of motion.  Lymphadenopathy:    He has no cervical adenopathy.  Neurological: He  is alert and oriented to person, place, and time. He has normal reflexes.  Skin: Skin is warm and dry.    ROS  Blood pressure 113/68, pulse 48, temperature 98.1 F (36.7 C), temperature source Oral, resp. rate 18, height 5\' 7"  (1.702 m), weight 58.968 kg (130 lb), SpO2 99 %.Body mass index is 20.36 kg/(m^2).  General Appearance: Casual  Eye Contact::  Minimal  Speech:  Slow  Volume:  Decreased  Mood:  Depressed  Affect:  Flat  Thought Process:  influenced by hallucinations  Orientation:  Full (Time, Place, and Person)  Thought Content:  Hallucinations: Auditory  Suicidal Thoughts:  No  Homicidal Thoughts:  No  Memory:  Immediate;   Fair Recent;   Fair Remote;   Fair  Judgement:  Fair  Insight:  Fair  Psychomotor Activity:  Decreased  Concentration:  Fair  Recall:  FiservFair  Fund of Knowledge:Fair  Language: Fair  Akathisia:  No  Handed:  Right  AIMS (if indicated):     Assets:  Desire for Improvement Financial Resources/Insurance Housing Social Support  Sleep:  Number of Hours: 2  Cognition: WNL  ADL's:  Intact     COGNITIVE FEATURES THAT CONTRIBUTE TO RISK:  None    SUICIDE RISK:   Moderate:  Frequent suicidal ideation with limited intensity, and duration, some specificity in terms of plans, no associated intent, good self-control, limited dysphoria/symptomatology, some risk factors present, and identifiable protective factors, including available and accessible social support.  PLAN OF CARE: Hospitalization, medication management, substance abuse counselling, discharge planning, psychiatric follow up.   Medical Decision Making:  New problem, with additional work up planned, Review of Psycho-Social Stressors (1), Review or order clinical lab tests (1),  Review of Medication Regimen & Side Effects (2) and Review of New Medication or Change in Dosage (2)   Justin Schaefer is a 24 year old male with a history of psychosis admitted for worsening of hallucinations in the context  of medication noncompliance and substance use.  1. Psychosis. He has been treated with Risperdal in the past. Did not feel it was helpful. The voices never resolved. He agreed to try Abilify 10 mg daily. He is open to injectable Abilify maintenance. Risks benefits and side effects of antipsychotic were discussed with the patient.  2. Insomnia. The patient has not been sleeping for weeks. Last night he slept 2 hours only. Will prescribe Restoril 15 mg as needed.  3. Mood. The patient endorses many symptoms of depression and he is isolated at home rather unhappy. He agrees to start Prozac for depression. He also reports symptoms of anxiety social type.  4. Smoking. Nicotine products are available.  5. Cannabis use. He is a regular user. He minimizes the problem and is not interested in substance abuse treatment..  6. Disposition. He will return to home with his brother. He will follow up with local provider [facility]. I certify that inpatient services furnished can reasonably be expected to improve the patient's condition.   Kristine LineaUCILOWSKA, Justin Schaefer 02/15/2015, 11:20 AM

## 2015-02-15 NOTE — ED Notes (Signed)
BEHAVIORAL HEALTH ROUNDING Patient sleeping: No. Patient alert and oriented: yes Behavior appropriate: Yes.  ; If no, describe:  Nutrition and fluids offered: Yes  Toileting and hygiene offered: Yes  Sitter present: yes Law enforcement present: Yes  

## 2015-02-15 NOTE — Progress Notes (Signed)
Recreation Therapy Notes  Date: 05.13.16 Time: 3:00 pm Location: Craft Room  Group Topic: Communication, Problem solving, Teamwork  Goal Area(s) Addresses:  Patient will work in teams towards shared goal. Patient will verbalize skills needed to make activity successful. Patient will verbalize benefit of using skills identified to reach post d/c goals.  Behavioral Response: Did not attend  Intervention: Landing Pad  Activity: Patients were given 12 straws and approximately 2.5 feet of tape and instructed to build a lnading pad to catch a golf ball that was dropped from approximately 4 feet.  Education: LRT educated patient on healthy support systems and why they are important.  Education Outcome: Did not attend   Clinical Observations/Feedback: Did not attend   Jacquelynn CreeGreene,Dajah Fischman M, LRT/CTRS 02/15/2015 4:05 PM

## 2015-02-15 NOTE — Progress Notes (Signed)
Patient was isolative in the room,did not attend groups.Stated that he is hearing voices like "big mistake".Reluctant to talk to staff.He maintained his personal hygiene.Compliant with meds.Denies suicidal & homicidal ideation.

## 2015-02-15 NOTE — Tx Team (Signed)
Initial Interdisciplinary Treatment Plan   PATIENT STRESSORS: Substance abuse   PATIENT STRENGTHS: Motivation for treatment/growth Physical Health   PROBLEM LIST: Problem List/Patient Goals Date to be addressed Date deferred Reason deferred Estimated date of resolution  Pt wouldn't list a goals or problems            Altered thought process AEB auditory hallucinations 02/15/2015     Increased risk for suicide 02/15/2015                                    DISCHARGE CRITERIA:  Ability to meet basic life and health needs Adequate post-discharge living arrangements Improved stabilization in mood, thinking, and/or behavior Medical problems require only outpatient monitoring Motivation to continue treatment in a less acute level of care Need for constant or close observation no longer present Reduction of life-threatening or endangering symptoms to within safe limits Safe-care adequate arrangements made Verbal commitment to aftercare and medication compliance  PRELIMINARY DISCHARGE PLAN: Attend PHP/IOP Participate in family therapy Return to previous living arrangement  PATIENT/FAMIILY INVOLVEMENT: This treatment plan has been presented to and reviewed with the patient, Justin BostonCarlos E Schaefer, and/or family member.  The patient and family have been given the opportunity to ask questions and make suggestions.  Justin Schaefer, Justin Schaefer 02/15/2015, 4:26 AM

## 2015-02-15 NOTE — Progress Notes (Signed)
Recreation Therapy Notes  At approximately 4:00 pm, LRT spoke with nursing regarding patient's assessment. Per nursing, patient hearing voices. Nursing reported patient did not want to talk to her and patient had been isolated all day. LRT will attempt assessment on Monday, 05.16.16.   Justin Schaefer,Justin Schaefer, LRT/CTRS 02/15/2015 4:33 PM

## 2015-02-15 NOTE — [facility] Group Notes (Signed)
[facility] Group Notes:  (Nursing/MHT/Case Management/Adjunct)  Date:  02/15/2015  Time:  12:22 PM  Type of Therapy:  Psychoeducational Skills  Participation Level:  Did Not Attend  Participation Quality:  n/a  Affect:  n/a  Cognitive:  n/a  Insight:  None  Engagement in Group:  n/a  Modes of Intervention:  n/a  Summary of Progress/Problems:  Lynelle SmokeCara Travis Meagan Ancona 02/15/2015, 12:22 PM

## 2015-02-16 MED ORDER — ARIPIPRAZOLE 10 MG PO TABS
10.0000 mg | ORAL_TABLET | Freq: Once | ORAL | Status: AC
  Administered 2015-02-16: 10 mg via ORAL
  Filled 2015-02-16: qty 1

## 2015-02-16 MED ORDER — ARIPIPRAZOLE 10 MG PO TABS
20.0000 mg | ORAL_TABLET | Freq: Every day | ORAL | Status: DC
  Administered 2015-02-17 – 2015-02-21 (×5): 20 mg via ORAL
  Filled 2015-02-16 (×6): qty 2

## 2015-02-16 MED ORDER — MENTHOL 3 MG MT LOZG
1.0000 | LOZENGE | OROMUCOSAL | Status: DC | PRN
  Administered 2015-02-16 – 2015-02-19 (×5): 3 mg via ORAL
  Filled 2015-02-16 (×2): qty 9

## 2015-02-16 MED ORDER — NICOTINE 14 MG/24HR TD PT24
14.0000 mg | MEDICATED_PATCH | Freq: Every day | TRANSDERMAL | Status: DC
Start: 2015-02-16 — End: 2015-02-21
  Administered 2015-02-16 – 2015-02-20 (×5): 14 mg via TRANSDERMAL
  Filled 2015-02-16 (×5): qty 1

## 2015-02-16 MED ORDER — NICOTINE 10 MG IN INHA
RESPIRATORY_TRACT | Status: AC
  Administered 2015-02-16: 1 via RESPIRATORY_TRACT
  Filled 2015-02-16: qty 36

## 2015-02-16 MED ORDER — ACETAMINOPHEN 325 MG PO TABS
650.0000 mg | ORAL_TABLET | Freq: Four times a day (QID) | ORAL | Status: DC | PRN
  Administered 2015-02-16: 650 mg via ORAL
  Filled 2015-02-16: qty 2

## 2015-02-16 MED ORDER — TEMAZEPAM 15 MG PO CAPS
30.0000 mg | ORAL_CAPSULE | Freq: Every day | ORAL | Status: DC
Start: 2015-02-16 — End: 2015-02-20
  Administered 2015-02-16 – 2015-02-19 (×4): 30 mg via ORAL
  Filled 2015-02-16 (×4): qty 2

## 2015-02-16 MED ORDER — MENTHOL 3 MG MT LOZG: 1.0000 | LOZENGE | OROMUCOSAL | Status: DC | PRN

## 2015-02-16 NOTE — [facility] Group Notes (Signed)
[facility] Group Notes:  (Nursing/MHT/Case Management/Adjunct)  Date:  02/16/2015  Time:  1:05 PM  Type of Therapy:  Group Therapy  Participation Level:  Did Not Attend    Justin Schaefer 02/16/2015, 1:05 PM

## 2015-02-16 NOTE — Plan of Care (Signed)
Problem: Alteration in thought process Goal: STG-Patient is able to discuss thoughts with staff Outcome: Progressing Pt. Able to talk about A/H.  Also able to voice his feelings of paranoia about the water being poisoned.

## 2015-02-16 NOTE — [facility] Group Notes (Signed)
[facility] LCSW Group Therapy  02/16/2015 2:32 PM  Type of Therapy:  Group Therapy  Participation Level:  Did Not Attend  Participation Quality:  n/a  Affect:  n/a  Cognitive:  n/a  Insight:  n/a  Engagement in Therapy:  n/a  Modes of Intervention:  n/a  Summary of Progress/Problems: Pt did not attend group.  Beryl MeagerIngle, Jeneane Pieczynski T 02/16/2015, 2:32 PM

## 2015-02-16 NOTE — Progress Notes (Addendum)
D: Patient denies SI/HI.  Patient affect and mood is depressed.  Patient did attend evening group. Patient visible on the milieu. No distress noted. A: Support and encouragement offered. Scheduled medications given to pt. Q 15 min checks continued for patient safety. R: Patient receptive. Patient remains safe on the unit.

## 2015-02-16 NOTE — [facility] Group Notes (Signed)
[facility] Group Notes:  (Nursing/MHT/Case Management/Adjunct)  Date:  02/16/2015  Time:  9:22 PM  Type of Therapy:  Group Therapy  Participation Level:  Did Not Attend  Participation Quality:  Did not attend  Affect:  Did not attend  Cognitive:  Did not attend  Insight:  None  Engagement in Group:  None  Modes of Intervention:  Did not attend  Summary of Progress/Problems:  Justin Schaefer Justin Schaefer Justin Schaefer 02/16/2015, 9:22 PM

## 2015-02-16 NOTE — Progress Notes (Signed)
Pt. Is cooperative.  Rates his depression as a 5, anxiety 5, hopelessness 7.  Denies SI/HI, VH.  States he continues to hear voices, they make noise, difficult to understand what they are saying but that they are annoying.  Also states that he thinks the water is "bad." when questioned further, admits that he feels someone (but he doesn't know who) might be trying to put something in the water for him such as to poison him.

## 2015-02-16 NOTE — Progress Notes (Signed)
[facility]  02/16/2015 1:13 PM Justin Schaefer  MRN:  [id number]  Subjective:  Mr. Justin Schaefer reports interrupted sleep last night. He is thinking is still disorganized. He is worries and voices, and go. This is highly unpredictable. He thinks that Abilify is already helping. He agrees to a higher dose. He agrees to injectable Abilify in the future. He has not been in groups due to heightened anxiety. He complains of sore throat. He feels that Nicotrol inhaler is not enough and is asking for a patch. He is meticulous in his grooming. I notice he brushed his teeth after lunch on herself here.. A he wiped and dry discussing after brushing his teeth. He is much more talkative today in spite of thought disorganization. He tries to communicate as best he can. He is not suicidal or homicidal.  Principal Problem: Schizophrenia Diagnosis:   Patient Active Problem List   Diagnosis Date Noted  . Schizophrenia [F20.9] 02/14/2015    Priority: High  . Tobacco use disorder [Z72.0] 02/15/2015    Priority: Medium  . Cannabis use disorder severe. [F12.10] 02/15/2015    Priority: Medium   Total Time spent with patient: 20 minutes   Past Medical History:  Past Medical History  Diagnosis Date  . 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 Cochlear implant     Family History: History reviewed. No pertinent family history. Social History:  History  Alcohol Use No     History  Drug Use  . Yes  . Special: Marijuana    History   Social History  . Marital Status: Single    Spouse Name: N/A  . Number of Children: N/A  . Years of Education: N/A   Social History Main Topics  . Smoking status: Current Every Day Smoker -- 0.25 packs/day    Types: Cigarettes  . Smokeless tobacco: Not on file  . Alcohol Use: No  . Drug Use: Yes    Special: Marijuana  . Sexual Activity: No   Other Topics Concern  . None   Social History Narrative    Additional History:    Sleep: Interrupted  Appetite:  Fair   Assessment:   Musculoskeletal: Strength & Muscle Tone: within normal limits Gait & Station: normal Patient leans: N/A   Psychiatric Specialty Exam: Physical Exam  Nursing note and vitals reviewed.   Review of Systems  All other systems reviewed and are negative.   Blood pressure 136/87, pulse 66, temperature 98.2 F (36.8 C), temperature source Oral, resp. rate 20, height 5\' 7"  (1.702 m), weight 58.968 kg (130 lb), SpO2 99 %.Body mass index is 20.36 kg/(m^2).  General Appearance: Casual  Eye Contact::  Fair  Speech:  Slow  Volume:  Decreased  Mood:  Depressed  Affect:  Constricted  Thought Process:  Disorganized  Orientation:  Full (Time, Place, and Person)  Thought Content:  Delusions and Rumination  Suicidal Thoughts:  No  Homicidal Thoughts:  No  Memory:  Immediate;   Fair Recent;   Fair Remote;   Fair  Judgement:  Fair  Insight:  Fair  Psychomotor Activity:  Decreased  Concentration:  Fair  Recall:  Poor  Fund of Knowledge:Fair  Language: Fair  Akathisia:  No  Handed:  Right  AIMS (if indicated):     Assets:  Communication Skills Desire for Improvement Financial Resources/Insurance Housing Physical Health Resilience Social Support  ADL's:  Intact  Cognition: WNL  Sleep:  Number of Hours: 7.25     Current Medications: Current Facility-Administered Medications  Medication Dose Route Frequency Provider Last Rate Last Dose  . FLUoxetine (PROZAC) capsule 20 mg  20 mg Oral QHS Shari ProwsJolanta B Mikell Camp, MD   20 mg at 02/15/15 2106  . menthol-cetylpyridinium (CEPACOL) lozenge 3 mg  1 lozenge Oral PRN [place] B Merrit Waugh, MD      . nicotine (NICODERM CQ - dosed in mg/24 hours) patch 14 mg  14 mg Transdermal Daily Marcelle Bebout B Porcha Deblanc, MD      . nicotine (NICOTROL) 10 MG inhaler 1 continuous puffing  1 continuous puffing Inhalation PRN Vallorie Niccoli B Ozzie Knobel, MD      . pneumococcal 23 valent  vaccine (PNU-IMMUNE) injection 0.5 mL  0.5 mL Intramuscular Tomorrow-1000 Audery AmelJohn T Clapacs, MD   0.5 mL at 02/16/15 0825  . temazepam (RESTORIL) capsule 15 mg  15 mg Oral QHS PRN Jeshua Ransford B Doloros Kwolek, MD        Lab Results: No results found for this or any previous visit (from the past 48 hour(s)).  Physical Findings: AIMS: Facial and Oral Movements Muscles of Facial Expression: None, normal Lips and Perioral Area: None, normal Jaw: None, normal Tongue: None, normal,Extremity Movements Upper (arms, wrists, hands, fingers): None, normal Lower (legs, knees, ankles, toes): None, normal, Trunk Movements Neck, shoulders, hips: None, normal, Overall Severity Severity of abnormal movements (highest score from questions above): None, normal Incapacitation due to abnormal movements: None, normal Patient's awareness of abnormal movements (rate only patient's report): No Awareness, Dental Status Current problems with teeth and/or dentures?: Yes Does patient usually wear dentures?: No  CIWA:  CIWA-Ar Total: 2 COWS:     Treatment Plan Summary: Daily contact with patient to assess and evaluate symptoms and progress in treatment and Medication management   Medical Decision Making:  Established Problem, Stable/Improving (1), Review of Psycho-Social Stressors (1), Review or order clinical lab tests (1), Review of Medication Regimen & Side Effects (2) and Review of New Medication or Change in Dosage (2)   Mr. Justin Schaefer is a 24 year old male with a history of psychosis admitted for worsening of hallucinations in the context of medication noncompliance and substance use.  1. Psychosis. He has been treated with Risperdal in the past. Did not feel it was helpful. The voices never resolved. He agreed to try Abilify 10 mg daily. He is open to injectable Abilify maintenance. Will increase Abilify to 20 mg Risks benefits and side effects of antipsychotic were discussed with the patient.  2. Insomnia. The patient  has not been sleeping for weeks. Last night he slept 2 hours only. We gave Restoril 15 mg as needed. Will offer standing restoril 30 mg.  3. Mood. The patient endorses many symptoms of depression and he is isolated at home rather unhappy. He agrees to start Prozac for depression. He also reports symptoms of anxiety social type.  4. Smoking. Nicotine products are available. We added Nicotine patch to his Nicotrol.  5. Cannabis use. He is a regular user. He minimizes the problem and is not interested in substance abuse treatment..  6. Sore throat. Will offer Cepacol.  7. Disposition. He will return to home with his brother. [facility]. The patient is intelligent, has i nsurance. He would be a good candidate.       Kristine LineaUCILOWSKA, Brihany Butch 02/16/2015, 1:13 PM

## 2015-02-16 NOTE — Progress Notes (Signed)
Pt. Has been in milieu but does not interact with peers.  Isolative at times to his room.  Pt. Has disorganized thinking, paranoia.  Pt. Is cooperative.  Has been quiet.  Medicated for complaints of pain in the back of his neck; sore throat.  Appetite good.

## 2015-02-17 MED ORDER — ARIPIPRAZOLE ER 400 MG IM SUSR
400.0000 mg | INTRAMUSCULAR | Status: DC
  Filled 2015-02-17: qty 400

## 2015-02-17 MED ORDER — MAGNESIUM HYDROXIDE 400 MG/5ML PO SUSP: 30.0000 mL | Freq: Every day | ORAL | Status: DC | PRN

## 2015-02-17 MED ORDER — ALUM & MAG HYDROXIDE-SIMETH 200-200-20 MG/5ML PO SUSP: 30.0000 mL | ORAL | Status: DC | PRN

## 2015-02-17 MED ORDER — ACETAMINOPHEN 325 MG PO TABS: 650.0000 mg | ORAL_TABLET | Freq: Four times a day (QID) | ORAL | Status: DC | PRN

## 2015-02-17 MED ORDER — ARIPIPRAZOLE ER 400 MG IM SUSR
400.0000 mg | Freq: Once | INTRAMUSCULAR | Status: AC
  Administered 2015-02-18: 400 mg via INTRAMUSCULAR
  Filled 2015-02-17 (×2): qty 400

## 2015-02-17 MED ORDER — HALOPERIDOL 0.5 MG PO TABS
1.0000 mg | ORAL_TABLET | Freq: Two times a day (BID) | ORAL | Status: DC
  Administered 2015-02-17 – 2015-02-19 (×5): 1 mg via ORAL
  Filled 2015-02-17 (×6): qty 2

## 2015-02-17 NOTE — Progress Notes (Signed)
Patient rates his depression a "5"; denies any SI and contracts for safety; admits to experiencing mild anxiety at times; continues to have auditory and visual hallucinations; states "I see blurry faces and hear voices all the time." Patient has been calm, quiet, and cooperative throughout the shift; no further voiced complaints; continue to monitor.

## 2015-02-17 NOTE — [facility] Counselor (Signed)
Adult Comprehensive Assessment  Patient ID: Justin Schaefer, male   DOB: 06-10-1991, 24 y.o.   MRN: [id number]  Information Source: Information source: Patient  Current Stressors:  Family Relationships: good Surveyor, quantityinancial / Lack of resources (include bankruptcy): applied for disability just waiting  Living/Environment/Situation:  Living Arrangements: Parent Living conditions (as described by patient or guardian): lives with dad and brother and sister lives with dads girlfreind How long has patient lived in current situation?: 5 years What is atmosphere in current home: Comfortable, Supportive  Family History:  Marital status: Single Does patient have children?: No  Childhood History:  By whom was/is the patient raised?: Mother Additional childhood history information: [place] Description of patient's relationship with caregiver when they were a child: good relationships with parents Patient's description of current relationship with people who raised him/her: good dont talk to Mom now for a while Does patient have siblings?: Yes Number of Siblings: 3 Description of patient's current relationship with siblings: good relationship Did patient suffer any verbal/emotional/physical/sexual abuse as a child?: No Did patient suffer from severe childhood neglect?: No Has patient ever been sexually abused/assaulted/raped as an adolescent or adult?: No Was the patient ever a victim of a crime or a disaster?: No Witnessed domestic violence?: No Has patient been effected by domestic violence as an adult?: No  Education:  Highest grade of school patient has completed: GED Currently a Consulting civil engineerstudent?: No Learning disability?: No  Employment/Work Situation:   Employment situation: Unemployed Patient's job has been impacted by current illness: No What is the longest time patient has a held a job?: na Where was the patient employed at that  time?: na Has patient ever been in the [employer] and Companymilitary?: No Has patient ever served in Buyer, retailcombat?: No  Financial Resources:   Surveyor, quantityinancial resources: No income Does patient have a Lawyerrepresentative payee or guardian?: No  Alcohol/Substance Abuse:   What has been your use of drugs/alcohol within the last 12 months?: smokes a little weed 1x month ( no problems identified) If attempted suicide, did drugs/alcohol play a role in this?: No Alcohol/Substance Abuse Treatment Hx: Denies past history Has alcohol/substance abuse ever caused legal problems?: No  Social Support System:   Conservation officer, natureatient's Community Support System: Good Describe Community Support System: father Type of faith/religion: non denominational How does patient's faith help to cope with current illness?: na  Leisure/Recreation:   Leisure and Hobbies: watch TV, documentaries,family  Strengths/Needs:   What things does the patient do well?: Im good at cleaning, and helping others In what areas does patient struggle / problems for patient: bordom, not motivated to clean, some challenges at home and finding work- Mental illness stops me from doing some things at times  Discharge Plan:   Does patient have access to transportation?: Yes Will patient be returning to same living situation after discharge?: Yes Currently receiving community mental health services: Yes (From Whom) ([facility]) If no, would patient like referral for services when discharged?: No Does patient have financial barriers related to discharge medications?: No (Attached to medication managment)  Summary/Recommendations:   Summary and Recommendations (to be completed by the evaluator): Patient is a 24 year old hispanic with diagnosis with schizophrenia came to ER because of depression and voices. Patient has followed with [facility] and medication managment and has signed consents for [facility] and his father.. Patient is agreeable to have follow up care in the community and will attend group and  take medications while in  treatment at the hospital. [facility] HospitalClaudine Javin Nong LCSW [place], [place] M. 02/17/2015

## 2015-02-17 NOTE — Plan of Care (Signed)
Problem: Alteration in thought process Goal: LTG-Patient behavior demonstrates decreased signs psychosis (Patient behavior demonstrates decreased signs of psychosis to the point the patient is safe to return home and continue treatment in an outpatient setting.)  Outcome: Progressing Patient is still experiencing visual and auditory hallucinations.

## 2015-02-17 NOTE — Progress Notes (Signed)
D) Patient pleasant and cooperative upon my assessment. Patient completed Self Inventory Assessment and  Reports that his appetite is good. Patient's affect is flat and mood is depressed, Patient rates depression as 510, patient rates hopeless feelings as 510. Patient denies SI/HI, denies A/V hallucinations.  Patient received cepacol lozenges for sore throat.  A) Patient offered support and encouragement, patient encouraged to discuss feelings/concerns with staff. Patient verbalized understanding. Patient monitored Q15 minutes for safety. Patient met with MD  to discuss today's goals and plan of care.  R) Patient visible in milieu, attending groups and spending time in the day room. Patient appropriate with staff and peers.   Patient taking medications as ordered. Will continue to monitor.

## 2015-02-17 NOTE — [facility] Group Notes (Signed)
[facility] LCSW Group Therapy  02/17/2015 2:41 PM  Type of Therapy:  Group Therapy  Participation Level:  Active  Participation Quality:  Attentive  Affect:  Flat  Cognitive:  Appropriate  Insight:  Improving  Engagement in Therapy:  Developing/Improving  Modes of Intervention:  Discussion, Education and Problem-solving  Summary of Progress/Problems::LCSW introduced group rules and gave suicide intervention and prevention handouts to patients. LCSW asked each patient to openly review if what they would do if the became suicidal. Patient was supported by LCSW and peer discussion ensued.  Patient seemed to be expressing his feelings around grudges but struggled a bit and explained he does not let his anger go because he rarely feels angry. He also stated if he became very depressed he would tell his father right away or the doctor. LCSW supported him and thanked him for coming to group    Justin Schaefer, Justin Schaefer 02/17/2015, 2:41 PM

## 2015-02-17 NOTE — Plan of Care (Signed)
Problem: Alteration in thought process Goal: LTG-Patient behavior demonstrates decreased signs psychosis (Patient behavior demonstrates decreased signs of psychosis to the point the patient is safe to return home and continue treatment in an outpatient setting.)  Outcome: Progressing Patient denies any hallucinations at present.     

## 2015-02-17 NOTE — Tx Team (Signed)
Interdisciplinary Treatment Plan Update (Adult)  Date:  02/17/2015 Time Reviewed:  10:41 AM  Progress in Treatment: Attending groups: Yes. Participating in groups:  Yes. Taking medication as prescribed:  Yes. Tolerating medication:  Yes. Family/Significant othe contact made:  No, will contact:  Dad on Monday Patient understands diagnosis:  Yes. Discussing patient identified problems/goals with staff:  Yes. Medical problems stabilized or resolved:  Yes. Denies suicidal/homicidal ideation: Yes. Issues/concerns per patient self-inventory:  Yes. Other:  New problem(s) identified: Yes, Describe:  Patient is hearing voices and seeing things  Discharge Plan or Barriers: patient to follow up with [facility]- Can return to live with his father  Reason for Continuation of Hospitalization: Depression Hallucinations Medication stabilization  Comments:Pt reporting he hears and sees voices-notified staff  Estimated length of stay: 7 days  New goal(s):   Review of initial/current patient goals per problem list:   Refer to plan of care  Attendees: Patient:  Justin BostonCarlos E Schaefer 5/15/201610:41 AM  Family:   5/15/201610:41 AM  Physician:   5/15/201610:41 AM  Nursing:    5/15/201610:41 AM  Case Manager:   5/15/201610:41 AM  Counselor:   5/15/201610:41 AM  Other:  [name] 5/15/201610:41 AM  Other:   5/15/201610:41 AM  Other:   5/15/201610:41 AM  Other:  5/15/201610:41 AM  Other:  5/15/201610:41 AM  Other:  5/15/201610:41 AM  Other:  5/15/201610:41 AM  Other:  5/15/201610:41 AM  Other:  5/15/201610:41 AM  Other:   5/15/201610:41 AM   Scribe for Treatment Team:   [name], Glenice Ciccone M, 02/17/2015, 10:41 AM

## 2015-02-17 NOTE — [facility] Group Notes (Signed)
[facility] Group Notes:  (Nursing/MHT/Case Management/Adjunct)  Date:  02/17/2015  Time:  10:32 AM  Type of Therapy:  Group Therapy  Participation Level:  Active  Participation Quality:  Appropriate, Attentive and Sharing  Affect:  Appropriate  Cognitive:  Appropriate  Insight:  Appropriate and Good  Engagement in Group:  Engaged  Modes of Intervention:  Education  Summary of Progress/Problems:  Justin RombergRegina Revonda Iram Schaefer 02/17/2015, 10:32 AM

## 2015-02-17 NOTE — Progress Notes (Signed)
[facility]  02/17/2015 11:33 AM Justin BostonCarlos E Schaefer  MRN:  [id number]  Subjective:  [name] reports further improvement. He slept well and feels rested. The voices are disappearing although he has never been voices frequently. They are not intrusive and he can disregard them most of the time. He however still is paranoid feeling that people are out to get him, and that water tastes funny most likely is poisoned. He will feel better if his water comes in close to bottles. He tolerates Abilify well and feels that it is helpful. He agreed to [employer] Maintena injection today. He will continue oral medications for 2 more weeks. We spoke at length about, more community in [place]. The patient seems interested.   Principal Problem: Schizophrenia Diagnosis:   Patient Active Problem List   Diagnosis Date Noted  . Schizophrenia [F20.9] 02/14/2015    Priority: High  . Tobacco use disorder [Z72.0] 02/15/2015    Priority: Medium  . Cannabis use disorder severe. [F12.10] 02/15/2015    Priority: Medium  . Undifferentiated schizophrenia [F20.3]    Total Time spent with patient: 20 minutes   Past Medical History:  Past Medical History  Diagnosis Date  . 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 Cochlear implant     Family History: History reviewed. No pertinent family history. Social History:  History  Alcohol Use No     History  Drug Use  . Yes  . Special: Marijuana    History   Social History  . Marital Status: Single    Spouse Name: N/A  . Number of Children: N/A  . Years of Education: N/A   Social History Main Topics  . Smoking status: Current Every Day Smoker -- 0.25 packs/day    Types: Cigarettes  . Smokeless tobacco: Not on file  . Alcohol Use: No  . Drug Use: Yes    Special: Marijuana  . Sexual Activity: No   Other Topics Concern  . None   Social History Narrative   Additional History:    Sleep:  Poor  Appetite:  Poor   Assessment:   Musculoskeletal: Strength & Muscle Tone: within normal limits Gait & Station: normal Patient leans: N/A   Psychiatric Specialty Exam: Physical Exam  Nursing note and vitals reviewed.   Review of Systems  HENT: Positive for sore throat.   All other systems reviewed and are negative.   Blood pressure 124/85, pulse 73, temperature 97.9 F (36.6 C), temperature source Oral, resp. rate 20, height 5\' 7"  (1.702 m), weight 58.968 kg (130 lb), SpO2 99 %.Body mass index is 20.36 kg/(m^2).  General Appearance: Casual  Eye Contact::  Fair  Speech:  Slow  Volume:  Decreased  Mood:  Depressed  Affect:  Flat  Thought Process:  influenced by paranoia and hallucinations.  Orientation:  Full (Time, Place, and Person)  Thought Content:  Delusions, Hallucinations: Auditory and Paranoid Ideation  Suicidal Thoughts:  No  Homicidal Thoughts:  No  Memory:  Immediate;   Fair Recent;   Fair Remote;   Fair  Judgement:  Impaired  Insight:  Lacking  Psychomotor Activity:  Decreased  Concentration:  Fair  Recall:  [employer]Fair  Fund of Knowledge:Fair  Language: Fair  Akathisia:  No  Handed:  Right  AIMS (if indicated):     Assets:  Communication Skills Desire for Improvement Financial Resources/Insurance Housing Social Support Talents/Skills  ADL's:  Intact  Cognition: WNL  Sleep:  Number of Hours: 6     Current Medications: Current Facility-Administered Medications  Medication Dose Route Frequency Provider Last Rate Last Dose  . acetaminophen (TYLENOL) tablet 650 mg  650 mg Oral Q6H PRN Shari Prows, MD   650 mg at 02/16/15 1422  . acetaminophen (TYLENOL) tablet 650 mg  650 mg Oral Q6H PRN Audery Amel, MD      . alum & mag hydroxide-simeth (MAALOX/MYLANTA) 200-200-20 MG/5ML suspension 30 mL  30 mL Oral Q4H PRN Audery Amel, MD      . ARIPiprazole (ABILIFY) tablet 20 mg  20 mg Oral Q breakfast Junell Cullifer B Aizlynn Digilio, MD   20 mg at 02/17/15  0815  . ARIPiprazole SUSR 400 mg  400 mg Intramuscular Once Shari Prows, MD      . Melene Muller ON 03/18/2015] ARIPiprazole SUSR 400 mg  400 mg Intramuscular Q28 days Jaykub Mackins B Trejuan Matherne, MD      . FLUoxetine (PROZAC) capsule 20 mg  20 mg Oral QHS Misheel Gowans B Shaheim Mahar, MD   20 mg at 02/16/15 2137  . haloperidol (HALDOL) tablet 1 mg  1 mg Oral BID Audery Amel, MD   1 mg at 02/17/15 1017  . magnesium hydroxide (MILK OF MAGNESIA) suspension 30 mL  30 mL Oral Daily PRN Audery Amel, MD      . menthol-cetylpyridinium (CEPACOL) lozenge 3 mg  1 lozenge Oral PRN Shari Prows, MD   3 mg at 02/17/15 1004  . nicotine (NICODERM CQ - dosed in mg/24 hours) patch 14 mg  14 mg Transdermal Daily Lindzy Rupert B Erionna Strum, MD   14 mg at 02/17/15 1002  . nicotine (NICOTROL) 10 MG inhaler 1 continuous puffing  1 continuous puffing Inhalation PRN Teanna Elem B Addley Ballinger, MD   1 continuous puffing at 02/16/15 1425  . pneumococcal 23 valent vaccine (PNU-IMMUNE) injection 0.5 mL  0.5 mL Intramuscular Tomorrow-1000 Audery Amel, MD   0.5 mL at 02/16/15 0825  . temazepam (RESTORIL) capsule 30 mg  30 mg Oral QHS Shari Prows, MD   30 mg at 02/16/15 2137    Lab Results: No results found for this or any previous visit (from the past 48 hour(s)).  Physical Findings: AIMS: Facial and Oral Movements Muscles of Facial Expression: None, normal Lips and Perioral Area: None, normal Jaw: None, normal Tongue: None, normal,Extremity Movements Upper (arms, wrists, hands, fingers): None, normal Lower (legs, knees, ankles, toes): None, normal, Trunk Movements Neck, shoulders, hips: None, normal, Overall Severity Severity of abnormal movements (highest score from questions above): None, normal Incapacitation due to abnormal movements: None, normal Patient's awareness of abnormal movements (rate only patient's report): No Awareness, Dental Status Current problems with teeth and/or dentures?: Yes Does patient  usually wear dentures?: No  CIWA:  CIWA-Ar Total: 2 COWS:     Treatment Plan Summary: Daily contact with patient to assess and evaluate symptoms and progress in treatment and Medication management   Medical Decision Making:  Established Problem, Stable/Improving (1), Review of Psycho-Social Stressors (1), Review or order clinical lab tests (1), Review of Medication Regimen & Side Effects (2) and Review of New Medication or Change in Dosage (2)   Mr. Reamer is a 24 year old male with a history of psychosis admitted for worsening of hallucinations in the context of medication noncompliance and substance use.  1. Psychosis. He has been treated with Risperdal in the past. Did not feel it was helpful. The voices never resolved. He agreed to try Abilify.  He seems to tolerate it well. He agrees to [employer]ify Maintena injection. This will be given today. He will continue every month in the community. Risks benefits and side effects of antipsychotic were discussed with the patient. We will continue oral Abilify for the next 2 weeks. Lipid panel and HgbA1C in am.  2. Insomnia. The patient slept better with 30 mg of Restoril.   3. Mood. The patient endorses many symptoms of depression and he is isolated at home rather unhappy. He agrees to start Prozac for depression. He also reports symptoms of anxiety social type.  4. Smoking. Nicotine products are available. We added Nicotine patch to his Nicotrol.  5. Cannabis use. He is a regular user. He minimizes the problem and is not interested in substance abuse treatment..  6. Sore throat. Will offer Cepacol.  7. Disposition. He will return to home with his brother. [facility]. The patient is intelligent, has i nsurance. He would be a good candidate.        Kristine LineaUCILOWSKA, Zanyla Klebba 02/17/2015, 11:33 AM

## 2015-02-18 LAB — LIPID PANEL
Cholesterol: 175 mg/dL (ref 0–200)
HDL: 34 mg/dL — AB (ref >40)
LDL Cholesterol: 104 mg/dL — ABNORMAL HIGH (ref 0–99)
Total CHOL/HDL Ratio: 5.1 RATIO
Triglycerides: 184 mg/dL — ABNORMAL HIGH (ref <150)
VLDL: 37 mg/dL (ref 0–40)

## 2015-02-18 NOTE — Progress Notes (Signed)
Recreation Therapy Notes  INPATIENT RECREATION THERAPY ASSESSMENT  Patient Details Name: Justin BostonCarlos E Schaefer MRN: [id number] DOB: 07/13/1991 Today's Date: 02/18/2015  Patient Stressors: Other (Comment) (Pressure in head)  Coping Skills:   Isolate, Avoidance, Exercise, Art/Dance, Music, Sports  Personal Challenges: Communication, Concentration, Decision-Making, Problem-Solving, Social Interaction  Leisure Interests (2+):  Sports - Exercise (Comment), Individual - TV (Push-ups)  Awareness of Community Resources:  Yes  Community Resources:  Park  Current Use: Yes  If no, Barriers?:    Patient Strengths:  Age and body  Patient Identified Areas of Improvement:  Not really  Current Recreation Participation:  Working with dad and eating  Patient Goal for Hospitalization:  To get better  [place]  County of Residence:  [place]   Current SI (including self-harm):  No  Current HI:  No  Consent to Intern Participation: N/A   Jacquelynn CreeGreene,Zanylah Hardie M, LRT/CTRS 02/18/2015, 4:34 PM

## 2015-02-18 NOTE — [facility] Group Notes (Signed)
[facility] LCSW Group Therapy  02/18/2015 5:24 PM  Type of Therapy:  Group Therapy  Participation Level:  Active  Participation Quality:  Attentive  Affect:  Appropriate  Cognitive:  Lacking  Insight:  Developing/Improving  Engagement in Therapy:  Developing/Improving  Modes of Intervention:  Discussion, Education, Exploration and Problem-solving  Summary of Progress/Problems:Summary of Progress/Problems:LCSW  Introduced group rules and all patients were asked to be supportive of their peers. LCSW introduced todays topic " Overcoming obstacles" Patients were asked to identify their obstacles as a collective group and then to problem solve in a team work fashion to reduce the obstacles and barriers they each face. LCSW facilitated peer to peer support and made suggestions to assist patients with their obstacles.  Patient appeared a bit quiet today and withdrawn. LCSW and peers did encourage participation but it appeared challenging for patient to come up with a obstacle. With some encouragement we talked about substance use that interferes with our emotions,. He was able to relate and did support other peers when they spoke. He made a lot of head nods as he understood other peers positions and contributions   [name], Amear Strojny M 02/18/2015, 5:24 PM

## 2015-02-18 NOTE — [facility] Group Notes (Signed)
Pt participated in goal/aftercare group. He presented flat, reserved, only spoke when asked to. He verbalizes that his goal is to take his medications today.  Pt otherwise appropriate. Unclear at this time if Pt experiencing AH/VH at this time. He is not actively responding to internal stimuli

## 2015-02-18 NOTE — Progress Notes (Signed)
Patient denies feeling depressed, but admits to having mild anxiety at times; states his hallucinations are getting better; has been calm, quiet, and cooperative throughout the shift; no voiced complaints; continue to monitor.

## 2015-02-18 NOTE — Progress Notes (Signed)
Patient alert and oriented to person place and self.  Affect blunted. Reports that he is "feeling better".  Abilify IM shot order has been received. Denies suicidal thoughts at this time.  Will cont to monitor for safety.

## 2015-02-18 NOTE — Progress Notes (Signed)
Recreation Therapy Notes  Date: 05.16.16 Time: 3:05 pm Location: Craft Room  Group Topic: Self-expression  Goal Area(s) Addresses:  Patient will effectively use art as a means of self-expression. Patient will recognize positive benefit of self-expression. Patient will be able to identify one emotion experienced during group session. Patient will identify use of art/self-expression as a coping skill.  Behavioral Response: Attentive  Intervention: Two Faces of Me  Activity: Patients were given a blank face worksheet and instructed to draw a line down the middle. On one side, they were instructed to draw/write how they felt when they were admitted to the hospital. On the other side, they were instructed to draw/write how they want to feel when they are d/c.   Education: LRT educated patients on different forms of self-expression  Education Outcome: Acknowledges education/In group clarification offered  Clinical Observations/Feedback: Patient drew two different faces and wrote how they were different. Patient did not contribute to group discussion.   Jacquelynn CreeGreene,Ulrich Soules M, LRT/CTRS 02/18/2015 4:03 PM

## 2015-02-18 NOTE — Progress Notes (Signed)
[facility]  02/18/2015 7:32 PM Justin Schaefer  MRN:  [id number]  Subjective:  Mr. Justin Schaefer reports further improvement. He has been drinking bottled liquids and is less paranoid about being poisoned. He slept well and feels rested. Voices are improvingand thinking is more clear. He tolerates Abilify well and feels that it is helpful. He agreed to [employer] Maintena injection. He will continue oral medications for 2 more weeks. We spoke again at length about, more community in [place]. The patient seems interested.   Principal Problem: Schizophrenia Diagnosis:   Patient Active Problem List   Diagnosis Date Noted  . Schizophrenia [F20.9] 02/14/2015    Priority: High  . Tobacco use disorder [Z72.0] 02/15/2015    Priority: Medium  . Cannabis use disorder severe. [F12.10] 02/15/2015    Priority: Medium  . Undifferentiated schizophrenia [F20.3]    Total Time spent with patient: 20 minutes   Past Medical History:  Past Medical History  Diagnosis Date  . 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 Cochlear implant     Family History: History reviewed. No pertinent family history. Social History:  History  Alcohol Use No     History  Drug Use  . Yes  . Special: Marijuana    History   Social History  . Marital Status: Single    Spouse Name: N/A  . Number of Children: N/A  . Years of Education: N/A   Social History Main Topics  . Smoking status: Current Every Day Smoker -- 0.25 packs/day    Types: Cigarettes  . Smokeless tobacco: Not on file  . Alcohol Use: No  . Drug Use: Yes    Special: Marijuana  . Sexual Activity: No   Other Topics Concern  . None   Social History Narrative   Additional History:    Sleep: Fair  Appetite:  Fair   Assessment:   Musculoskeletal: Strength & Muscle Tone: within normal limits Gait & Station: normal Patient leans: N/A   Psychiatric Specialty Exam: Physical  Exam  Nursing note and vitals reviewed.   Review of Systems  All other systems reviewed and are negative.   Blood pressure 115/81, pulse 90, temperature 97.8 F (36.6 C), temperature source Oral, resp. rate 20, height 5\' 7"  (1.702 m), weight 58.968 kg (130 lb), SpO2 99 %.Body mass index is 20.36 kg/(m^2).  General Appearance: Disheveled  Eye SolicitorContact::  Fair  Speech:  Clear and Coherent  Volume:  Normal  Mood:  Euthymic  Affect:  Appropriate  Thought Process:  Logical  Orientation:  Full (Time, Place, and Person)  Thought Content:  Delusions, Hallucinations: Auditory and Paranoid Ideation  Suicidal Thoughts:  No  Homicidal Thoughts:  No  Memory:  Immediate;   Fair Recent;   Fair Remote;   Fair  Judgement:  Fair  Insight:  Fair  Psychomotor Activity:  Decreased  Concentration:  Fair  Recall:  FiservFair  Fund of Knowledge:Fair  Language: Fair  Akathisia:  No  Handed:  Right  AIMS (if indicated):     Assets:  Communication Skills Desire for Improvement Financial Resources/Insurance  ADL's:  Intact  Cognition: WNL  Sleep:  Number of Hours: 6.45     Current Medications: Current Facility-Administered Medications  Medication Dose Route Frequency Provider Last Rate Last Dose  . acetaminophen (TYLENOL) tablet 650 mg  650 mg Oral Q6H PRN Shari ProwsJolanta B Takyla Kuchera, MD   650 mg at 02/16/15 1422  .  acetaminophen (TYLENOL) tablet 650 mg  650 mg Oral Q6H PRN Audery Amel, MD      . alum & mag hydroxide-simeth (MAALOX/MYLANTA) 200-200-20 MG/5ML suspension 30 mL  30 mL Oral Q4H PRN Audery Amel, MD      . ARIPiprazole (ABILIFY) tablet 20 mg  20 mg Oral Q breakfast Arlet Marter B Nessa Ramaker, MD   20 mg at 02/18/15 0814  . [START ON 03/18/2015] ARIPiprazole SUSR 400 mg  400 mg Intramuscular Q28 days Ansen Sayegh B Dari Carpenito, MD      . FLUoxetine (PROZAC) capsule 20 mg  20 mg Oral QHS Jad Johansson B Lisaann Atha, MD   20 mg at 02/17/15 2125  . haloperidol (HALDOL) tablet 1 mg  1 mg Oral BID Audery Amel, MD    1 mg at 02/18/15 0815  . magnesium hydroxide (MILK OF MAGNESIA) suspension 30 mL  30 mL Oral Daily PRN Audery Amel, MD      . menthol-cetylpyridinium (CEPACOL) lozenge 3 mg  1 lozenge Oral PRN Shari Prows, MD   3 mg at 02/18/15 1153  . nicotine (NICODERM CQ - dosed in mg/24 hours) patch 14 mg  14 mg Transdermal Daily Maui Ahart B Senay Sistrunk, MD   14 mg at 02/18/15 0815  . nicotine (NICOTROL) 10 MG inhaler 1 continuous puffing  1 continuous puffing Inhalation PRN Venia Riveron B Safiyah Cisney, MD   1 continuous puffing at 02/17/15 1400  . pneumococcal 23 valent vaccine (PNU-IMMUNE) injection 0.5 mL  0.5 mL Intramuscular Tomorrow-1000 Audery Amel, MD   0.5 mL at 02/16/15 0825  . temazepam (RESTORIL) capsule 30 mg  30 mg Oral QHS Shari Prows, MD   30 mg at 02/17/15 2125    Lab Results:  Results for orders placed or performed during the hospital encounter of 02/15/15 (from the past 48 hour(s))  Lipid panel     Status: Abnormal   Collection Time: 02/18/15  6:56 AM  Result Value Ref Range   Cholesterol 175 0 - 200 mg/dL   Triglycerides 540 (H) <150 mg/dL   HDL 34 (L) >08 mg/dL   Total CHOL/HDL Ratio 5.1 RATIO   VLDL 37 0 - 40 mg/dL   LDL Cholesterol 676 (H) 0 - 99 mg/dL    Comment:        Total Cholesterol/HDL:CHD Risk Coronary Heart Disease Risk Table                     Men   Women  1/2 Average Risk   3.4   3.3  Average Risk       5.0   4.4  2 X Average Risk   9.6   7.1  3 X Average Risk  23.4   11.0        Use the calculated Patient Ratio above and the CHD Risk Table to determine the patient's CHD Risk.        ATP III CLASSIFICATION (LDL):  <100     mg/dL   Optimal  195-093  mg/dL   Near or Above                    Optimal  130-159  mg/dL   Borderline  267-124  mg/dL   High  >580     mg/dL   Very High     Physical Findings: AIMS: Facial and Oral Movements Muscles of Facial Expression: None, normal Lips and Perioral Area: None, normal Jaw: None, normal Tongue:  None, normal,Extremity  Movements Upper (arms, wrists, hands, fingers): None, normal Lower (legs, knees, ankles, toes): None, normal, Trunk Movements Neck, shoulders, hips: None, normal, Overall Severity Severity of abnormal movements (highest score from questions above): None, normal Incapacitation due to abnormal movements: None, normal Patient's awareness of abnormal movements (rate only patient's report): No Awareness, Dental Status Current problems with teeth and/or dentures?: Yes Does patient usually wear dentures?: No  CIWA:  CIWA-Ar Total: 0 COWS:     Treatment Plan Summary: Daily contact with patient to assess and evaluate symptoms and progress in treatment and Medication management   Medical Decision Making:  Established Problem, Stable/Improving (1), Review of Psycho-Social Stressors (1), Review or order clinical lab tests (1), Review of Medication Regimen & Side Effects (2) and Review of New Medication or Change in Dosage (2)   Mr. Justin Schaefer is a 24 year old male with a history of psychosis admitted for worsening of hallucinations in the context of medication noncompliance and substance use.  1. Psychosis. He has been treated with Risperdal in the past. Did not feel it was helpful. The voices never resolved. He agreed to try Abilify. He seems to tolerate it well. He agrees to [employer] Maintena injection. This will be given today. He will continue every month in the community. Risks benefits and side effects of antipsychotic were discussed with the patient. We will continue oral Abilify for the next 2 weeks. Lipid panel aelevated. nd HgbA1C in am.  2. Insomnia. The patient slept better with 30 mg of Restoril.   3. Mood. The patient endorses many symptoms of depression and he is isolated at home rather unhappy. He agrees to start Prozac for depression. He also reports symptoms of anxiety social type.  4. Smoking. Nicotine products are available. We added Nicotine patch to his  Nicotrol.  5. Cannabis use. He is a regular user. He minimizes the problem and is not interested in substance abuse treatment..  6. Sore throat. Will offer Cepacol.  7. Disposition. He will return to home with his brother. [facility]. The patient is intelligent, has i nsurance. He would be a good candidate.      Kristine LineaUCILOWSKA, Maxamillian Tienda 02/18/2015, 7:32 PM

## 2015-02-19 LAB — HEMOGLOBIN A1C: Hgb A1c MFr Bld: 5.2 % (ref 4.0–6.0)

## 2015-02-19 MED ORDER — ACETAMINOPHEN 325 MG PO TABS
650.0000 mg | ORAL_TABLET | Freq: Every morning | ORAL | Status: DC
  Administered 2015-02-20 – 2015-02-21 (×2): 650 mg via ORAL
  Filled 2015-02-19 (×2): qty 2

## 2015-02-19 NOTE — [facility] Group Notes (Signed)
[facility] Group Notes:  (Nursing/MHT/Case Management/Adjunct)  Date:  02/19/2015  Time:  10:48 PM  Type of Therapy:  Group Therapy  Participation Level:  Active  Participation Quality:  Appropriate  Affect:  Appropriate  Cognitive:  Appropriate  Insight:  Good  Engagement in Group:  Engaged  Modes of Intervention:  Education  Summary of Progress/Problems:  Justin Schaefer Justin Schaefer 02/19/2015, 10:48 PM

## 2015-02-19 NOTE — Progress Notes (Signed)
D: Patient denies SI/HI/AVH.  Patient affect is flat. Mood is depressed.  Patient did NOT attend evening group. Patient visible on the milieu. No distress noted. A: Support and encouragement offered. Scheduled medications given to pt. Q 15 min checks continued for patient safety. R: Patient receptive. Patient remains safe on the unit.   

## 2015-02-19 NOTE — Progress Notes (Signed)
Patient noted to pace   around ion room during shift .Limited interaction with his peers . Appetite good and voice no concerns around sleep. Limited participation with peers and staff. Patient denies auditory hallucinations

## 2015-02-19 NOTE — Progress Notes (Signed)
[facility]  02/19/2015 12:12 PM Justin BostonCarlos E Schaefer  MRN:  [id number]  Subjective:  Mr. Justin Schaefer complains of feeling physically weak today. He feels that he is eyelids are dropping. He has somewhat unreasonable explanation that it happens because following Abilify Maintena injection yesterday at known he went to sleep. He reports interrupted sleep last night in spite of high-dose of Restoril. He complains of aches and pains and requests Tylenol in the morning. He still has sore throat. Vital signs are stable. He is not feverish. He reports some improvement in his thought process. Thinks more logical now. He has more insight into his mental illness. For example he tells me that he still feels unsafe in the hospital, paranoid that people are out to get him. That sometimes he thinks that maybe it's not as but just his thinking. At home when stressed out paranoid, or hallucinating and he will take a walk. He finds it very helpful. He is unable to use this coping skill in the hospital. He denies symptoms of depression. The voices are still there but the patient told me today that he does not feel comfortable disclosing the context content. Appetite has improved. He is drinking bottled liquids. He still worries about the quality or safety of the water. We discussed how the community again. At the patient is not interested. He lives in a trailer owned by his father with his brother. He drives his father to his appointments. And feels confident in the community.  Principal Problem: Schizophrenia Diagnosis:   Patient Active Problem List   Diagnosis Date Noted  . Schizophrenia [F20.9] 02/14/2015    Priority: High  . Tobacco use disorder [Z72.0] 02/15/2015    Priority: Medium  . Cannabis use disorder severe. [F12.10] 02/15/2015    Priority: Medium  . Undifferentiated schizophrenia [F20.3]    Total Time spent with patient: 20 minutes   Past Medical History:  Past Medical History  Diagnosis Date  .  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 Cochlear implant     Family History: History reviewed. No pertinent family history. Social History:  History  Alcohol Use No     History  Drug Use  . Yes  . Special: Marijuana    History   Social History  . Marital Status: Single    Spouse Name: N/A  . Number of Children: N/A  . Years of Education: N/A   Social History Main Topics  . Smoking status: Current Every Day Smoker -- 0.25 packs/day    Types: Cigarettes  . Smokeless tobacco: Not on file  . Alcohol Use: No  . Drug Use: Yes    Special: Marijuana  . Sexual Activity: No   Other Topics Concern  . None   Social History Narrative   Additional History:    Sleep: Poor  Appetite:  Fair   Assessment:   Musculoskeletal: Strength & Muscle Tone: within normal limits Gait & Station: normal Patient leans: N/A   Psychiatric Specialty Exam: Physical Exam  Nursing note and vitals reviewed.   Review of Systems  Musculoskeletal: Positive for myalgias.  All other systems reviewed and are negative.   Blood pressure 141/90, pulse 118, temperature 98.3 F (36.8 C), temperature source Oral, resp. rate 20, height 5\' 7"  (1.702 m), weight 58.968 kg (130 lb), SpO2 99 %.Body mass index is 20.36 kg/(m^2).  General Appearance: Casual  Eye Contact::  Good  Speech:  Clear and  Coherent  Volume:  Normal  Mood:  Anxious  Affect:  Congruent  Thought Process:  Loose  Orientation:  Full (Time, Place, and Person)  Thought Content:  Delusions, Hallucinations: Auditory and Paranoid Ideation  Suicidal Thoughts:  No  Homicidal Thoughts:  No  Memory:  Immediate;   Fair Recent;   Fair Remote;   Fair  Judgement:  Fair  Insight:  Fair  Psychomotor Activity:  Normal  Concentration:  Fair  Recall:  [employer] of Knowledge:Fair  Language: Fair  Akathisia:  No  Handed:  Right  AIMS (if indicated):     Assets:  Communication  Skills Desire for Improvement Financial Resources/Insurance Housing Physical Health Resilience Social Support  ADL's:  Intact  Cognition: WNL  Sleep:  Number of Hours: 6.5     Current Medications: Current Facility-Administered Medications  Medication Dose Route Frequency Provider Last Rate Last Dose  . [START ON 02/20/2015] acetaminophen (TYLENOL) tablet 650 mg  650 mg Oral q morning - 10a Jezelle Gullick B Fantashia Shupert, MD      . alum & mag hydroxide-simeth (MAALOX/MYLANTA) 200-200-20 MG/5ML suspension 30 mL  30 mL Oral Q4H PRN Audery Amel, MD      . ARIPiprazole (ABILIFY) tablet 20 mg  20 mg Oral Q breakfast Merwin Breden B Cord Wilczynski, MD   20 mg at 02/19/15 0746  . [START ON 03/18/2015] ARIPiprazole SUSR 400 mg  400 mg Intramuscular Q28 days Reymond Maynez B Latarsha Zani, MD      . FLUoxetine (PROZAC) capsule 20 mg  20 mg Oral QHS Kace Hartje B Tonnie Stillman, MD   20 mg at 02/18/15 2132  . magnesium hydroxide (MILK OF MAGNESIA) suspension 30 mL  30 mL Oral Daily PRN Audery Amel, MD      . menthol-cetylpyridinium (CEPACOL) lozenge 3 mg  1 lozenge Oral PRN Shari Prows, MD   3 mg at 02/19/15 1207  . nicotine (NICODERM CQ - dosed in mg/24 hours) patch 14 mg  14 mg Transdermal Daily Shealynn Saulnier B Jayanth Szczesniak, MD   14 mg at 02/19/15 0929  . nicotine (NICOTROL) 10 MG inhaler 1 continuous puffing  1 continuous puffing Inhalation PRN Shari Prows, MD   1 continuous puffing at 02/19/15 1207  . pneumococcal 23 valent vaccine (PNU-IMMUNE) injection 0.5 mL  0.5 mL Intramuscular Tomorrow-1000 Audery Amel, MD   0.5 mL at 02/16/15 0825  . temazepam (RESTORIL) capsule 30 mg  30 mg Oral QHS Shari Prows, MD   30 mg at 02/18/15 2133    Lab Results:  Results for orders placed or performed during the hospital encounter of 02/15/15 (from the past 48 hour(s))  Lipid panel     Status: Abnormal   Collection Time: 02/18/15  6:56 AM  Result Value Ref Range   Cholesterol 175 0 - 200 mg/dL   Triglycerides 409  (H) <150 mg/dL   HDL 34 (L) >81 mg/dL   Total CHOL/HDL Ratio 5.1 RATIO   VLDL 37 0 - 40 mg/dL   LDL Cholesterol 191 (H) 0 - 99 mg/dL    Comment:        Total Cholesterol/HDL:CHD Risk Coronary Heart Disease Risk Table                     Men   Women  1/2 Average Risk   3.4   3.3  Average Risk       5.0   4.4  2 X Average Risk   9.6   7.1  3 X Average Risk  23.4   11.0        Use the calculated Patient Ratio above and the CHD Risk Table to determine the patient's CHD Risk.        ATP III CLASSIFICATION (LDL):  <100     mg/dL   Optimal  161-096100-129  mg/dL   Near or Above                    Optimal  130-159  mg/dL   Borderline  045-409160-189  mg/dL   High  >811>190     mg/dL   Very High     Physical Findings: AIMS: Facial and Oral Movements Muscles of Facial Expression: None, normal Lips and Perioral Area: None, normal Jaw: None, normal Tongue: None, normal,Extremity Movements Upper (arms, wrists, hands, fingers): None, normal Lower (legs, knees, ankles, toes): None, normal, Trunk Movements Neck, shoulders, hips: None, normal, Overall Severity Severity of abnormal movements (highest score from questions above): None, normal Incapacitation due to abnormal movements: None, normal Patient's awareness of abnormal movements (rate only patient's report): No Awareness, Dental Status Current problems with teeth and/or dentures?: Yes Does patient usually wear dentures?: No  CIWA:  CIWA-Ar Total: 0 COWS:     Treatment Plan Summary: Daily contact with patient to assess and evaluate symptoms and progress in treatment and Medication management   Medical Decision Making:  Established Problem, Stable/Improving (1), Review of Psycho-Social Stressors (1), Review or order clinical lab tests (1), Review of Medication Regimen & Side Effects (2) and Review of New Medication or Change in Dosage (2)   Mr. Justin Schaefer is a 24 year old male with a history of psychosis admitted for worsening of hallucinations in  the context of medication noncompliance and substance use.  1. Psychosis. He is taking Abilify. He accepted Abilify maintena on 02/18/2015. the community. Risks benefits and side effects of antipsychotic were discussed with the patient. We will continue oral Abilify for the next 2 weeks. Lipid panel slightly elevated, and HgbA1C is normal.   2. Insomnia. The patient slept better with 30 mg of Restoril.   3. Mood. The patient endorses many symptoms of depression. He staredt Prozac. He also reports symptoms of anxiety social type.  4. Smoking. Nicotine products are available. We added Nicotine patch to his Nicotrol.  5. Cannabis use. He is a regular user. He minimizes the problem and is not interested in substance abuse treatment..  6. Sore throat. Will offer Cepacol.  7. Disposition. He will return to home with his brother. [facility]. [facility].   Kristine LineaUCILOWSKA, Breyer Tejera 02/19/2015, 12:12 PM

## 2015-02-19 NOTE — Plan of Care (Signed)
Problem: [facility] Participation in Recreation Therapeutic Interventions Goal: STG-Patient will demonstrate improved communication skills b STG: Communication - Within 3 treatment sessions, patient will use healthy communication in each of 2 recreational therapy 1:1 treatment session to increase communication skills post d/c.  Outcome: Progressing Treatment Session 1; Completed 1 out of 2: At approximately 12:35 pm, LRT met with patient in craft room. Patient was instructed to take as much toilet paper as he was use in three hours. For each square patient took, he was instructed to give a fun fact. Patient able to give fun fact for each square. LRT educated patient on communication and why it is important.  Leonette Monarch, LRT/CTRS 05.17.16 4:06 pm

## 2015-02-19 NOTE — [facility] Group Notes (Signed)
[facility] Group Notes:  (Nursing/MHT/Case Management/Adjunct)  Date:  02/19/2015  Time:  11:57 AM  Type of Therapy:  Psychoeducational Skills  Participation Level:  Active  Participation Quality:  Appropriate, Attentive and Sharing  Affect:  Appropriate  Cognitive:  Alert and Appropriate  Insight:  Appropriate and Good  Engagement in Group:  Engaged  Modes of Intervention:  Discussion and Education  Summary of Progress/Problems:  Lynelle SmokeCara Travis [facility] LPMadoni 02/19/2015, 11:57 AM

## 2015-02-19 NOTE — Progress Notes (Signed)
Recreation Therapy Notes  Date: 05.17.16 Time: 3:00 pm Location: Craft Room  Group Topic: Self-esteem   Goal Area(s) Addresses:  Patient will write at least one positive trait about self. Patient will verbalize ways to increase self-esteem  Behavioral Response: Attentive  Intervention: I Am  Activity: Patients were given a worksheet with the big letter I on it and were instructed to fill it with as many positive traits about themselves as they could.   Education: LRT educated patient on self-esteem and ways to increase self-esteem.  Education Outcome: Acknowledges education/In group clarification offered  Clinical Observations/Feedback: Patient completed activity. Patient did not contribute to group discussion.   Jacquelynn CreeGreene,Rabon Scholle M, LRT/CTRS 02/19/2015 3:45 PM

## 2015-02-19 NOTE — Plan of Care (Signed)
Problem: Alteration in thought process Goal: LTG-Patient behavior demonstrates decreased signs psychosis (Patient behavior demonstrates decreased signs of psychosis to the point the patient is safe to return home and continue treatment in an outpatient setting.)  Outcome: Progressing Able to report to staff Goal: LTG-Patient is able to perceive the environment accurately Outcome: Progressing Able to alert staff for inconsistency

## 2015-02-20 MED ORDER — TEMAZEPAM 15 MG PO CAPS
15.0000 mg | ORAL_CAPSULE | Freq: Every day | ORAL | Status: DC
  Filled 2015-02-20: qty 1

## 2015-02-20 NOTE — Progress Notes (Signed)
D:.  Patient affect is flat. Mood is depressed.  Patient reports auditory hallucinations, however he states that it is just loud noise.  Patient did attend evening group. Patient visible on the milieu. No distress noted. A: Support and encouragement offered. Scheduled medications given to pt. Q 15 min checks continued for patient safety. R: Patient receptive. Patient remains safe on the unit.

## 2015-02-20 NOTE — [facility] Group Notes (Signed)
[facility]  02/20/2015 3:12 PM  Participation Quality:  Attentive  Affect:  Blunted  Cognitive:  Appropriate  Insight:  Developing/Improving  Engagement in Group:  Developing/Improving  Modes of Intervention:  Discussion, Exploration and Problem-solving  Summary of Progress/Problems:Patients goal is to be discharged and to return home  HansellBandi, Eyla Tallon M 02/20/2015, 3:12 PM

## 2015-02-20 NOTE — Progress Notes (Signed)
Patient stated he slept well last night. Voice of appetite good and energy level normal. Depression 0, Hopelessness 0, and anxiety 0. (low 0-10 high). Patient denies suicidal ideations. Patient denies auditory  hallucinations .  Attending unit programming. Limited interaction with his peers. Continue to wear scrubs on unit.

## 2015-02-20 NOTE — Plan of Care (Signed)
Problem: Alteration in thought process Goal: LTG-Patient behavior demonstrates decreased signs psychosis (Patient behavior demonstrates decreased signs of psychosis to the point the patient is safe to return home and continue treatment in an outpatient setting.)  Outcome: Progressing Alerting  Staff for concerns  Goal: LTG-Patient is able to perceive the environment accurately Outcome: Progressing Oriented X4

## 2015-02-20 NOTE — Progress Notes (Signed)
[facility]  02/20/2015 3:01 PM Justin BostonCarlos E Schaefer  MRN:  [id number]  Subjective:   [name] reports much improvement today. He no longer hears voices. He no longer feels paranoid about water. He has been drinking Water with ice and reports that it tastes good. He no longer worries about his safety and no longer has a feeling of being watched in the hospital. He believes that he will do even better at home. He is not suicidal or homicidal. He tolerates medications well including Abilify Maintena injection. He will follow up with [facility] and their community support team. He complains of minor aches and pains for which he requested Tylenol. He sre throat is better.  Principal Problem: Schizophrenia Diagnosis:   Patient Active Problem List   Diagnosis Date Noted  . Schizophrenia [F20.9] 02/14/2015    Priority: High  . Tobacco use disorder [Z72.0] 02/15/2015    Priority: Medium  . Cannabis use disorder severe. [F12.10] 02/15/2015    Priority: Medium  . Undifferentiated schizophrenia [F20.3]    Total Time spent with patient: 20 minutes   Past Medical History:  Past Medical History  Diagnosis Date  . 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 Cochlear implant     Family History: History reviewed. No pertinent family history. Social History:  History  Alcohol Use No     History  Drug Use  . Yes  . Special: Marijuana    History   Social History  . Marital Status: Single    Spouse Name: N/A  . Number of Children: N/A  . Years of Education: N/A   Social History Main Topics  . Smoking status: Current Every Day Smoker -- 0.25 packs/day    Types: Cigarettes  . Smokeless tobacco: Not on file  . Alcohol Use: No  . Drug Use: Yes    Special: Marijuana  . Sexual Activity: No   Other Topics Concern  . None   Social History Narrative   Additional History:    Sleep: Good  Appetite:  Good  Assessment:    Musculoskeletal: Strength & Muscle Tone: within normal limits Gait & Station: normal Patient leans: N/A   Psychiatric Specialty Exam: Physical Exam  Nursing note and vitals reviewed.   Review of Systems  Musculoskeletal: Positive for myalgias.  All other systems reviewed and are negative.   Blood pressure 144/80, pulse 103, temperature 97.5 F (36.4 C), temperature source Oral, resp. rate 20, height 5\' 7"  (1.702 m), weight 58.968 kg (130 lb), SpO2 99 %.Body mass index is 20.36 kg/(m^2).  General Appearance: Casual  Eye Contact::  Fair  Speech:  Clear and Coherent  Volume:  Normal  Mood:  Euthymic  Affect:  Congruent  Thought Process:  Goal Directed and Logical  Orientation:  Full (Time, Place, and Person)  Thought Content:  Delusions, Hallucinations: Auditory resolving. and Paranoid Ideation  Suicidal Thoughts:  No  Homicidal Thoughts:  No  Memory:  Immediate;   Fair Recent;   Fair Remote;   Fair  Judgement:  Fair  Insight:  Fair  Psychomotor Activity:  Normal  Concentration:  Fair  Recall:  [employer]  Fund of Knowledge:Fair  Language: Fair  Akathisia:  No  Handed:  Right  AIMS (if indicated):     Assets:  Communication Skills Desire for Improvement Financial Resources/Insurance Housing Physical Health Resilience Social Support Transportation  ADL's:  Intact  Cognition: WNL  Sleep:  Number of  Hours: 6.5     Current Medications: Current Facility-Administered Medications  Medication Dose Route Frequency Provider Last Rate Last Dose  . acetaminophen (TYLENOL) tablet 650 mg  650 mg Oral q morning - 10a Leeya Rusconi B Tashae Inda, MD   650 mg at 02/20/15 0954  . alum & mag hydroxide-simeth (MAALOX/MYLANTA) 200-200-20 MG/5ML suspension 30 mL  30 mL Oral Q4H PRN Audery Amel, MD      . ARIPiprazole (ABILIFY) tablet 20 mg  20 mg Oral Q breakfast Norwin Aleman B Geovanni Rahming, MD   20 mg at 02/20/15 0755  . [START ON 03/18/2015] ARIPiprazole SUSR 400 mg  400 mg Intramuscular Q28  days Labrian Torregrossa B Aireal Slater, MD      . FLUoxetine (PROZAC) capsule 20 mg  20 mg Oral QHS Haru Anspaugh B Tija Biss, MD   20 mg at 02/19/15 2147  . magnesium hydroxide (MILK OF MAGNESIA) suspension 30 mL  30 mL Oral Daily PRN Audery Amel, MD      . menthol-cetylpyridinium (CEPACOL) lozenge 3 mg  1 lozenge Oral PRN Shari Prows, MD   3 mg at 02/19/15 1207  . nicotine (NICODERM CQ - dosed in mg/24 hours) patch 14 mg  14 mg Transdermal Daily Shari Prows, MD   14 mg at 02/20/15 0954  . nicotine (NICOTROL) 10 MG inhaler 1 continuous puffing  1 continuous puffing Inhalation PRN Shari Prows, MD   1 continuous puffing at 02/19/15 1207  . pneumococcal 23 valent vaccine (PNU-IMMUNE) injection 0.5 mL  0.5 mL Intramuscular Tomorrow-1000 Audery Amel, MD   0.5 mL at 02/16/15 0825  . temazepam (RESTORIL) capsule 30 mg  30 mg Oral QHS Lelar Farewell B Nadya Hopwood, MD   30 mg at 02/19/15 2147    Lab Results: No results found for this or any previous visit (from the past 48 hour(s)).  Physical Findings: AIMS: Facial and Oral Movements Muscles of Facial Expression: None, normal Lips and Perioral Area: None, normal Jaw: None, normal Tongue: None, normal,Extremity Movements Upper (arms, wrists, hands, fingers): None, normal Lower (legs, knees, ankles, toes): None, normal, Trunk Movements Neck, shoulders, hips: None, normal, Overall Severity Severity of abnormal movements (highest score from questions above): None, normal Incapacitation due to abnormal movements: None, normal Patient's awareness of abnormal movements (rate only patient's report): No Awareness, Dental Status Current problems with teeth and/or dentures?: Yes Does patient usually wear dentures?: No  CIWA:  CIWA-Ar Total: 0 COWS:     Treatment Plan Summary: Daily contact with patient to assess and evaluate symptoms and progress in treatment and Medication management   Medical Decision Making:  Established Problem,  Stable/Improving (1), Review of Psycho-Social Stressors (1), Review or order clinical lab tests (1), Review of Medication Regimen & Side Effects (2) and Review of New Medication or Change in Dosage (2)   Mr. Barot is a 24 year old male with a history of schizophrenia admitted for worsening psychosis in the context of treatment noncompliance.  1. Psychosis. He is taking Abilify. He accepted Abilify maintena on 02/18/2015. the community. Risks benefits and side effects of antipsychotic were discussed with the patient. We will continue oral Abilify for the next 2 weeks. Lipid panel slightly elevated, and HgbA1C is normal.   2. Insomnia. We lower restoril to 15 mg.    3. Mood. The patient endorses many symptoms of depression. He staredt Prozac. He also reports symptoms of anxiety social type.  4. Smoking. Nicotine products are available. We added Nicotine patch to his Nicotrol.  5. Cannabis use.  He is a regular user. He minimizes the problem and is not interested in substance abuse treatment..  6. Sore throat. Will offer Cepacol.  7. Disposition. He will return to home with his brother and father tomorrow. [facility]. [facility].     Kristine LineaUCILOWSKA, Finnis Colee 02/20/2015, 3:01 PM

## 2015-02-20 NOTE — [facility] Group Notes (Signed)
[facility] Group Notes:  (Nursing/MHT/Case Management/Adjunct)  Date:  02/20/2015  Time:  12:09 PM  Type of Therapy:  Psychoeducational Skills  Participation Level:  Active  Participation Quality:  Appropriate, Attentive and Sharing  Affect:  Appropriate  Cognitive:  Alert and Appropriate  Insight:  Appropriate  Engagement in Group:  Engaged  Modes of Intervention:  Discussion and Education  Summary of Progress/Problems:  Lynelle SmokeCara Travis [facility] 02/20/2015, 12:09 PM

## 2015-02-20 NOTE — Progress Notes (Signed)
Recreation Therapy Notes  Date: 05.18.16 Time: 3:00 pm Location: Craft Room  Group Topic: Wellness  Goal Area(s) Addresses:  Patient will identify at least one item per dimension of health. Patient will examine areas they are deficient in.  Behavioral Response: Attentive  Intervention: 6 Dimensions of Health  Activity: Patients were given a worksheet with the definitions of the 6 dimensions on it and a worksheet with the 6 dimensions on it. Patients were instructed to list 1-2 things per each category that they were currently doing.  Education: LRT educated patients on the 6 dimensions of wellness.   Education Outcome: Acknowledges education/In group clarification offered  Clinical Observations/Feedback: Patient completed activity by listing at least 2 things per each category. Patient did not contribute to group discussion.   Jacquelynn CreeGreene,Jeffren Dombek M, LRT/CTRS 02/20/2015 4:08 PM

## 2015-02-20 NOTE — Plan of Care (Signed)
Problem: [facility] Participation in Recreation Therapeutic Interventions Goal: STG-Patient will demonstrate improved communication skills b STG: Communication - Within 3 treatment sessions, patient will use healthy communication in each of 2 recreational therapy 1:1 treatment session to increase communication skills post d/c.  Outcome: Completed/Met Date Met:  02/20/15 Treatment Session 2; Completed 2 out of 2: At approximately 3:40 pm, LRT met with patient in craft room. Patient used healthy communication skills during a role playing exercise. Patient reported it felt good. LRT encouraged patient to use his communication skills.  Leonette Monarch, LRT/CTRS 05.18.16 4:42 pm

## 2015-02-21 DIAGNOSIS — F203 Undifferentiated schizophrenia: Secondary | ICD-10-CM | POA: Insufficient documentation

## 2015-02-21 MED ORDER — TEMAZEPAM 15 MG PO CAPS: 15.0000 mg | ORAL_CAPSULE | Freq: Every day | ORAL | Status: DC

## 2015-02-21 MED ORDER — TEMAZEPAM 15 MG PO CAPS
15.0000 mg | ORAL_CAPSULE | Freq: Every day | ORAL | Status: DC
Start: 2015-02-21 — End: 2015-02-21

## 2015-02-21 MED ORDER — ARIPIPRAZOLE ER 400 MG IM SUSR: 400.0000 mg | INTRAMUSCULAR | Status: DC

## 2015-02-21 MED ORDER — ARIPIPRAZOLE 20 MG PO TABS: 20.0000 mg | ORAL_TABLET | Freq: Every day | ORAL | Status: DC

## 2015-02-21 MED ORDER — FLUOXETINE HCL 20 MG PO CAPS: 20.0000 mg | ORAL_CAPSULE | Freq: Every day | ORAL | Status: DC

## 2015-02-21 NOTE — [facility] Suicide Risk Assessment (Signed)
[facility] INPATIENT:  Family/Significant Other Suicide Prevention Education  Suicide Prevention Education:  Education Completed; Tomasa Hosearlos Shugart Sr., Father,,  (name of family member/significant other) has been identified by the patient as the family member/significant other with whom the patient will be residing, and identified as the person(s) who will aid the patient in the event of a mental health crisis (suicidal ideations/suicide attempt).  With written consent from the patient, the family member/significant other has been provided the following suicide prevention education, prior to the and/or following the discharge of the patient.  The suicide prevention education provided includes the following:  Suicide risk factors  Suicide prevention and interventions  National Suicide Hotline telephone number  [facility] assessment telephone number  [facility] Emergency Assistance 911  [facility] and/or Residential Mobile Crisis Unit telephone number  Request made of family/significant other to:  Remove weapons (e.g., guns, rifles, knives), all items previously/currently identified as safety concern.    Remove drugs/medications (over-the-counter, prescriptions, illicit drugs), all items previously/currently identified as a safety concern.  The family member/significant other verbalizes understanding of the suicide prevention education information provided.  The family member/significant other agrees to remove the items of safety concern listed above.  Glennon MacLaws, Areon Cocuzza P 02/21/2015, 1:18 PM

## 2015-02-21 NOTE — [facility] Suicide Risk Assessment (Signed)
[facility]   Demographic Factors:  Male and Adolescent or young adult  Total Time spent with patient: 30 minutes  Musculoskeletal: Strength & Muscle Tone: within normal limits Gait & Station: normal Patient leans: Backward and N/A  Psychiatric Specialty Exam: Physical Exam  Nursing note and vitals reviewed.   Review of Systems  All other systems reviewed and are negative.   Blood pressure 143/97, pulse 86, temperature 98.1 F (36.7 C), temperature source Oral, resp. rate 20, height 5\' 7"  (1.702 m), weight 58.968 kg (130 lb), SpO2 99 %.Body mass index is 20.36 kg/(m^2).  General Appearance: Casual  Eye Contact::  Good  Speech:  Clear and Coherent409  Volume:  Normal  Mood:  Euthymic  Affect:  Appropriate  Thought Process:  Goal Directed, Linear and Logical  Orientation:  Full (Time, Place, and Person)  Thought Content:  WDL  Suicidal Thoughts:  No  Homicidal Thoughts:  No  Memory:  Immediate;   Fair Recent;   Fair Remote;   Fair  Judgement:  Fair  Insight:  Fair  Psychomotor Activity:  Normal  Concentration:  Fair  Recall:  [employer]Fair  Fund of Knowledge:Fair  Language: Fair  Akathisia:  No  Handed:  Right  AIMS (if indicated):     Assets:  Communication Skills Desire for Improvement Financial Resources/Insurance Housing Physical Health Resilience Social Support Transportation  Sleep:  Number of Hours: 6  Cognition: WNL  ADL's:  Intact   Have you used any form of tobacco in the last 30 days? (Cigarettes, Smokeless Tobacco, Cigars, and/or Pipes): Yes  Has this patient used any form of tobacco in the last 30 days? (Cigarettes, Smokeless Tobacco, Cigars, and/or Pipes) Yes, A prescription for an FDA-approved tobacco cessation medication was offered at discharge and the patient refused  Mental Status Per Nursing Assessment::   On Admission:  NA  Current Mental Status by Physician: NA  Loss Factors: NA  Historical Factors: NA  Risk  Reduction Factors:   Sense of responsibility to family, Living with another person, especially a relative, Positive social support and Positive therapeutic relationship  Continued Clinical Symptoms:  Schizophrenia:   Less than 22 years old  Cognitive Features That Contribute To Risk:  None    Suicide Risk:  Minimal: No identifiable suicidal ideation.  Patients presenting with no risk factors but with morbid ruminations; may be classified as minimal risk based on the severity of the depressive symptoms  Principal Problem: Schizophrenia Discharge Diagnoses:  Patient Active Problem List   Diagnosis Date Noted  . Schizophrenia [F20.9] 02/14/2015    Priority: High  . Tobacco use disorder severe. [Z72.0] 02/15/2015    Priority: Medium  . Cannabis use disorder severe. [F12.10] 02/15/2015    Priority: Medium    Follow-up Information    Follow up with [facility]. Go on 02/25/2015.   Why:  7:00am. Keep in touch with Mr. Unk PintoHarvey Bryant [phone] [facility] to assist with follow up.appointment for , Outpatient Medication Management and CST referral   Contact information:   [address] [place] [address] [phone]; fax-[phone]       Plan Of Care/Follow-up recommendations:  Activity:  as tolerated Diet:  low sodium loe fat heart healthy  Other:  keep follow up appointments.  Is patient on multiple antipsychotic therapies at discharge:  No   Has Patient had three or more failed trials of antipsychotic monotherapy by history:  No  Recommended Plan for Multiple Antipsychotic Therapies: NA    PUCILOWSKA,  JOLANTA 02/21/2015, 9:11 AM

## 2015-02-21 NOTE — Progress Notes (Signed)
Recreation Therapy Notes  INPATIENT RECREATION TR PLAN  Patient Details Name: SYED ZUKAS MRN: [id number] DOB: [birth year] Today's Date: 02/21/2015  Rec Therapy Plan Is patient appropriate for Therapeutic Recreation?: Yes Treatment times per week: At least 3 times a week TR Treatment/Interventions: 1:1 session, Group participation (Comment) (Appropriate participation in daily recreation therapy tx)  Discharge Criteria Pt will be discharged from therapy if:: Discharged Treatment plan/goals/alternatives discussed and agreed upon by:: Patient/family  Discharge Summary Short term goals set: See Care Plan Short term goals met: Complete Progress toward goals comments: One-to-one attended Which groups?: Wellness, Self-esteem, Other (Comment) (Self-expression) One-to-one attended: Communication Reason goals not met: N/A Therapeutic equipment acquired: None Reason patient discharged from therapy: Discharge from hospital Pt/family agrees with progress & goals achieved: Yes Date patient discharged from therapy: 02/21/15   Leonette Monarch, LRT/CTRS 02/21/2015, 5:29 PM

## 2015-02-21 NOTE — [facility] Group Notes (Signed)
[facility] LCSW Group Therapy  02/21/2015 3:14 PM  Type of Therapy:  Group Therapy  Participation Level:  Active  Participation Quality:  Appropriate and Attentive  Affect:  Appropriate  Cognitive:  Alert, Appropriate and Oriented  Insight:  Improving  Engagement in Therapy:  Engaged  Modes of Intervention:  Socialization and Support  Summary of Progress/Problems: Pt attended group and participated in discussion appropriately. Pt shared that he gets joy from "reading" and pt reports that he likes to read non-fiction science.  Beryl MeagerIngle, Kol Consuegra T 02/21/2015, 3:14 PM

## 2015-02-21 NOTE — Progress Notes (Signed)
D: Patient affect is flat and his mood is depressed.  Patient is apprehensive.  Patient did NOT attend evening group. Patient remained isolated in his room throughout the night. No distress noted. A: Support and encouragement offered. Scheduled medications refused by patient. Q 15 min checks continued for patient safety. R: Patient receptive. Patient remains safe on the unit.

## 2015-02-21 NOTE — Progress Notes (Signed)
  [facility] :  Will you be returning to the same living situation after discharge:  Yes,  home with father and brother At discharge, do you have transportation home?: Yes,  Father will pick him up Do you have the ability to pay for your medications: Yes,  Medicaid  Release of information consent forms completed and in the chart;  Patient's signature needed at discharge.  Patient to Follow up at: Follow-up Information    Follow up with [facility]. Go on 02/25/2015.   Why:  7:00am. Keep in touch with Mr. Unk PintoHarvey Bryant [phone] [facility] to assist with follow up.appointment for , Outpatient Medication Management and CST referral   Contact information:   [address] [place] [address] [phone]; fax-[phone]       Patient denies SI/HI: Yes,       Safety Planning and Suicide Prevention discussed: Yes,      Have you used any form of tobacco in the last 30 days? (Cigarettes, Smokeless Tobacco, Cigars, and/or Pipes): Yes  Has patient been referred to the Quitline?: Patient refused referral  Glennon MacLaws, Robby Pirani P, LCSW 02/21/2015, 1:19 PM

## 2015-02-21 NOTE — Tx Team (Signed)
Interdisciplinary Treatment Plan Update (Adult)  Date:  02/21/2015 Time Reviewed:  9:30 AM  Progress in Treatment: Attending groups: Yes. Participating in groups:  Yes. Taking medication as prescribed:  Yes. Tolerating medication:  Yes. Family/Significant othe contact made:  No, will contact:  Dad on Monday Patient understands diagnosis:  Yes. Discussing patient identified problems/goals with staff:  Yes. Medical problems stabilized or resolved:  Yes. Denies suicidal/homicidal ideation: Yes. Issues/concerns per patient self-inventory:  Yes. Other:  New problem(s) identified: Yes, Describe:  Patient is hearing voices and seeing things  Discharge Plan or Barriers: patient to follow up with [facility] for medication management and CST- Can return to live with his father  Reason for Continuation of Hospitalization: will discharge today Depression Hallucinations Medication stabilization  Comments:Pt reporting he hears and sees voices-notified staff  Estimated length of stay: 0 days  New goal(s):   Review of initial/current patient goals per problem list:   Refer to plan of care  Attendees: Patient:   5/19/20169:30 AM  Family:   5/19/20169:30 AM  Physician:  Kristine LineaJolanta Pucilowska, MD 5/19/20169:30 AM  Nursing:    5/19/20169:30 AM  Case Manager:   5/19/20169:30 AM  Counselor:   5/19/20169:30 AM  Other:  Delorse LekLynn Washington, LCSW 5/19/20169:30 AM  Other:  Beryl MeagerJason Chasmine Lender, LCSWA 5/19/20169:30 AM  Other:  Carola FrostLee Ann Roush,  5/19/20169:30 AM  Other: Hershal CoriaBeth Greene, LRT 5/19/20169:30 AM  Other:  5/19/20169:30 AM  Other:  5/19/20169:30 AM  Other:  5/19/20169:30 AM  Other:  5/19/20169:30 AM  Other:  5/19/20169:30 AM  Other:  Unk PintoHarvey Bryant, [facility] 5/19/20169:30 AM   Scribe for Treatment Team:   Beryl MeagerIngle, Kynlei Piontek T, 02/21/2015, 9:30 AM

## 2015-02-21 NOTE — Discharge Summary (Signed)
Physician Discharge Summary Note  Patient:  Michael BostonCarlos E Feinstein is an 24 y.o., male MRN:  [id number] DOB:  1990-10-16 Patient phone:  [phone] (home)  Patient address[place]   [address] [place] [id number],  Total Time spent with patient: 30 minutes  Date of Admission:  02/15/2015 Date of Discharge: 02/12/2015  Reason for Admission:  Auditory hallucinations and paranoid delusions.  Identifying data. [name] is a 24 year old male with history of psychosis.  Chief complaint. "I heard voices."  History of present illness. [name] has a history of psychosis. He has been hearing voices for over a year now. He was admitted to the hospital a year ago and started on Risperdal. He took it briefly. He never had resolutions of auditory hallucinations. He did not like the way the Risperdal made him feel. He did not follow-up with outpatient providers. For the past 2 weeks or so, at the patient has been in distress due to voices growing louder. He is unable to tell me the content of his hallucinations. It is unclear whether he has commands. He hears a "funny" voice but is unable to explain why this is distressing. During my interview he clearly is listening to his voices. He reports depressed mood with poor sleep, decreased appetite, anhedonia, feeling of guilt and hopelessness worthlessness, social isolation, crying spells, anhedonia, and heightened anxiety. He denies suicidal ideation. His insomnia has been very severe and in the past several days he did not sleep at all. He denies symptoms suggestive of bipolar mania. He denies alcohol or illicit substance use but is positive for cannabinoids on admission.  Past psychiatric history. One prior hospitalization for psychosis. Treated with Risperdal with no improvement. He has no provider in the community. There were no suicide attempts.  Family psychiatric history. None reported.  Social history. The patient graduated from high school. 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 He lives with his brother. The brother works most of the time. The patient has no friends or family. He is very isolated.  Principal Problem: Schizophrenia Discharge Diagnoses: Patient Active Problem List   Diagnosis Date Noted  . Schizophrenia [F20.9] 02/14/2015    Priority: High  . Tobacco use disorder severe. [Z72.0] 02/15/2015    Priority: Medium  . Cannabis use disorder severe. [F12.10] 02/15/2015    Priority: Medium    Musculoskeletal: Strength & Muscle Tone: within normal limits Gait & Station: normal Patient leans: N/A  Psychiatric Specialty Exam: Physical Exam  Nursing note and vitals reviewed.   Review of Systems  All other systems reviewed and are negative.   Blood pressure 143/97, pulse 86, temperature 98.1 F (36.7 C), temperature source Oral, resp. rate 20, height 5\' 7"  (1.702 m), weight 58.968 kg (130 lb), SpO2 99 %.Body mass index is 20.36 kg/(m^2).  See SRA.                                                  Sleep:  Number of Hours: 6   Have you used any form of tobacco in the last 30 days? (Cigarettes, Smokeless Tobacco, Cigars, and/or Pipes): Yes  Has this patient used any form of tobacco in the last 30 days? (Cigarettes, Smokeless Tobacco, Cigars, and/or Pipes) Yes, A prescription for an FDA-approved tobacco cessation medication was offered at discharge and the patient refused  Past  Medical History:  Past Medical History  Diagnosis Date  . 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 Cochlear implant     Family History: History reviewed. No pertinent family history. Social History:  History  Alcohol Use No     History  Drug Use  . Yes  . Special: Marijuana    History   Social History  . Marital Status: Single    Spouse Name: N/A  . Number of Children: N/A  . Years of Education: N/A   Social History Main Topics  . Smoking  status: Current Every Day Smoker -- 0.25 packs/day    Types: Cigarettes  . Smokeless tobacco: Not on file  . Alcohol Use: No  . Drug Use: Yes    Special: Marijuana  . Sexual Activity: No   Other Topics Concern  . None   Social History Narrative    Past Psychiatric History: Hospitalizations:  Outpatient Care:  Substance Abuse Care:  Self-Mutilation:  Suicidal Attempts:  Violent Behaviors:   Risk to Self: Is patient at risk for suicide?: Yes What has been your use of drugs/alcohol within the last 12 months?: smokes a little weed 1x month ( no problems identified) Risk to Others:   Prior Inpatient Therapy:   Prior Outpatient Therapy:    Level of Care:  OP  Hospital Course:  [name] is a [age] year old male with a history of schizophrenia admitted for worsening psychosis in the context of treatment noncompliance.  1. Psychosis. He was started on Abilify. He accepted Abilify maintena on 02/18/2015. Risks benefits and side effects of antipsychotic were discussed with the patient. We will continue oral Abilify for the next 2 weeks. Lipid panel slightly elevated, HgbA1C is normal.   2. Insomnia. He responded well to Restoril to 15 mg.   3. Mood. The patient was started on Prozac.for anxiety and depression.   4. Smoking. Nicotine products are available.He is not interested in smoking cessation.   5. Cannabis use. He is a regular user. He minimizes the problem and is not interested in substance abuse treatment..  6. Disposition. He was discharged to home with family. He will follow up with [facility] and their CST. [facility].    Consults:  None  Significant Diagnostic Studies:  None  Discharge Vitals:   Blood pressure 143/97, pulse 86, temperature 98.1 F (36.7 C), temperature source Oral, resp. rate 20, height 5\' 7"  (1.702 m), weight 58.968 kg (130 lb), SpO2 99 %. Body mass index is 20.36 kg/(m^2). Lab Results:   No results found for  this or any previous visit (from the past 72 hour(s)).  Physical Findings: AIMS: Facial and Oral Movements Muscles of Facial Expression: None, normal Lips and Perioral Area: None, normal Jaw: None, normal Tongue: None, normal,Extremity Movements Upper (arms, wrists, hands, fingers): None, normal Lower (legs, knees, ankles, toes): None, normal, Trunk Movements Neck, shoulders, hips: None, normal, Overall Severity Severity of abnormal movements (highest score from questions above): None, normal Incapacitation due to abnormal movements: None, normal Patient's awareness of abnormal movements (rate only patient's report): No Awareness, Dental Status Current problems with teeth and/or dentures?: Yes Does patient usually wear dentures?: No  CIWA:  CIWA-Ar Total: 0 COWS:      See Psychiatric Specialty Exam and Suicide Risk Assessment completed by Attending Physician prior to discharge.  Discharge destination:  Home  Is patient on multiple antipsychotic therapies at discharge:  No   Has Patient had three or more failed trials of antipsychotic monotherapy by history:  No    Recommended Plan for Multiple Antipsychotic Therapies: NA  Discharge Instructions    Diet - low sodium heart healthy    Complete by:  As directed      Increase activity slowly    Complete by:  As directed             Medication List    TAKE these medications      Indication   ARIPiprazole 20 MG tablet  Commonly known as:  ABILIFY  Take 1 tablet (20 mg total) by mouth daily with breakfast.   Indication:  Schizophrenia     ARIPiprazole 400 MG Susr  Inject 400 mg into the muscle every 28 (twenty-eight) days.  Start taking on:  03/18/2015      FLUoxetine 20 MG capsule  Commonly known as:  PROZAC  Take 1 capsule (20 mg total) by mouth at bedtime.   Indication:  Depression     temazepam 15 MG capsule  Commonly known as:  RESTORIL  Take 1 capsule (15 mg total) by mouth at bedtime.   Indication:  Trouble  Sleeping           Follow-up Information    Follow up with [facility]. Go on 02/25/2015.   Why:  7:00am. Keep in touch with Mr. Unk Pinto [phone] at [facility] to assist with follow up.appointment for , Outpatient Medication Management and CST referral   Contact information:   [address] [place] [address] [phone]; [phone]       Follow-up recommendations:  Activity:  as tolerated Diet:  low sodium low cholesterol heart healthy Other:  keep follow up appointments.  Comments:    Total Discharge Time: 40 min  Signed: Kristine Linea 02/21/2015, 9:21 AM

## 2015-02-21 NOTE — [facility] Group Notes (Signed)
[facility] Group Notes:  (Nursing/MHT/Case Management/Adjunct)  Date:  02/21/2015  Time:  9:03 AM  Type of Therapy:  Community Meeting   Participation Level:  Did Not Attend  Pretty Weltman De'Chelle Ulah Olmo 02/21/2015, 9:03 AM

## 2015-02-21 NOTE — [facility] Group Notes (Signed)
[facility] Group Notes:  (Nursing/MHT/Case Management/Adjunct)  Date:  02/21/2015  Time:  1:48 PM  Type of Therapy:  Group Therapy  Participation Level:  Minimal  Participation Quality:  Resistant  Affect:  Appropriate  Cognitive:  Lacking  Insight:  Good  Engagement in Group:  Limited  Modes of Intervention:  Socialization  Summary of Progress/Problems:  Justin Schaefer 02/21/2015, 1:48 PM

## 2015-02-21 NOTE — Progress Notes (Signed)
Patient aware of discharge home. Patient denies suicidal and homicidal thoughts. Patient returning home. Patient received all belongings. Writer reviewed discharge instructions, Aware of follow up appointment and next injection date for Abilify. Prescription given to patient. Patient leaving with sister.

## 2016-01-08 ENCOUNTER — Emergency Department
Admission: EM | Admit: 2016-01-08 | Discharge: 2016-01-09 | Disposition: A | Discharge status: Other Acute Inpatient Hospital | Payer: Medicaid Other | Attending: Emergency Medicine | Admitting: Emergency Medicine

## 2016-01-08 ENCOUNTER — Encounter: Payer: Self-pay | Admitting: Emergency Medicine

## 2016-01-08 DIAGNOSIS — Z9621 Cochlear implant status: Secondary | ICD-10-CM | POA: Diagnosis not present

## 2016-01-08 DIAGNOSIS — F209 Schizophrenia, unspecified: Secondary | ICD-10-CM | POA: Insufficient documentation

## 2016-01-08 DIAGNOSIS — F1210 Cannabis abuse, uncomplicated: Secondary | ICD-10-CM | POA: Diagnosis not present

## 2016-01-08 DIAGNOSIS — F17210 Nicotine dependence, cigarettes, uncomplicated: Secondary | ICD-10-CM | POA: Diagnosis not present

## 2016-01-08 DIAGNOSIS — R45851 Suicidal ideations: Secondary | ICD-10-CM | POA: Diagnosis not present

## 2016-01-08 DIAGNOSIS — F489 Nonpsychotic mental disorder, unspecified: Secondary | ICD-10-CM | POA: Diagnosis not present

## 2016-01-08 DIAGNOSIS — F203 Undifferentiated schizophrenia: Secondary | ICD-10-CM | POA: Diagnosis not present

## 2016-01-08 HISTORY — DX: Schizophrenia, unspecified: F20.9

## 2016-01-08 LAB — ACETAMINOPHEN LEVEL

## 2016-01-08 LAB — URINE DRUG SCREEN, QUALITATIVE (ARMC ONLY)
AMPHETAMINES, UR SCREEN: NOT DETECTED
BENZODIAZEPINE, UR SCRN: NOT DETECTED
Barbiturates, Ur Screen: NOT DETECTED
Cannabinoid 50 Ng, Ur ~~LOC~~: POSITIVE — AB
Cocaine Metabolite,Ur ~~LOC~~: POSITIVE — AB
MDMA (ECSTASY) UR SCREEN: NOT DETECTED
Methadone Scn, Ur: NOT DETECTED
Opiate, Ur Screen: NOT DETECTED
Phencyclidine (PCP) Ur S: NOT DETECTED
Tricyclic, Ur Screen: NOT DETECTED

## 2016-01-08 LAB — CBC
HCT: 48.9 % (ref 40.0–52.0)
Hemoglobin: 16.5 g/dL (ref 13.0–18.0)
MCH: 29.2 pg (ref 26.0–34.0)
MCHC: 33.7 g/dL (ref 32.0–36.0)
MCV: 86.6 fL (ref 80.0–100.0)
PLATELETS: 166 10*3/uL (ref 150–440)
RBC: 5.65 MIL/uL (ref 4.40–5.90)
RDW: 12.9 % (ref 11.5–14.5)
WBC: 9.1 10*3/uL (ref 3.8–10.6)

## 2016-01-08 LAB — COMPREHENSIVE METABOLIC PANEL
ALBUMIN: 4.5 g/dL (ref 3.5–5.0)
ALT: 17 U/L (ref 17–63)
ANION GAP: 9 (ref 5–15)
AST: 25 U/L (ref 15–41)
Alkaline Phosphatase: 63 U/L (ref 38–126)
BILIRUBIN TOTAL: 0.8 mg/dL (ref 0.3–1.2)
BUN: 11 mg/dL (ref 6–20)
CHLORIDE: 104 mmol/L (ref 101–111)
CO2: 24 mmol/L (ref 22–32)
Calcium: 9.1 mg/dL (ref 8.9–10.3)
Creatinine, Ser: 1.03 mg/dL (ref 0.61–1.24)
GFR calc Af Amer: >60 mL/min (ref >60)
GFR calc non Af Amer: >60 mL/min (ref >60)
Glucose, Bld: 107 mg/dL — ABNORMAL HIGH (ref 65–99)
Potassium: 4.3 mmol/L (ref 3.5–5.1)
Sodium: 137 mmol/L (ref 135–145)
TOTAL PROTEIN: 7.6 g/dL (ref 6.5–8.1)

## 2016-01-08 LAB — SALICYLATE LEVEL: Salicylate Lvl: <4 mg/dL (ref 2.8–30.0)

## 2016-01-08 LAB — ETHANOL

## 2016-01-08 NOTE — ED Notes (Signed)
[facility], accompanied by ED tech and ODS officer.

## 2016-01-08 NOTE — ED Notes (Signed)
Pt observed with no unusual behavior  Appropriate to stimulation  No verbalized needs or concerns at this time  NAD assessed  Continue to monitor 

## 2016-01-08 NOTE — ED Provider Notes (Signed)
[facility] Emergency Department Provider Note ____________________________________________  Time seen: Approximately 4:36 PM  I have reviewed the triage vital signs and the nursing notes.   HISTORY  Chief Complaint Suicidal  HPI Justin Schaefer is a 25 y.o. male with a history of schizophrenia who presents to the ER after he purchased a gun with intent of committing suicide; police were called by neighbors when patient fired his weapon 3 times. Per their IVC papers, when they arrived he raised his weapon at them and told them he did so in hopes that they would shoot him and kill him. She states he had these suicidal thoughts for "a while". Though his records list that he is on the medications to me he denies being on medications and states he did not know he was supposed to be taking them.  Past Medical History  Diagnosis Date  . 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 Schizophrenia [facility])     Patient Active Problem List   Diagnosis Date Noted  . Undifferentiated schizophrenia (HCC)   . Tobacco use disorder severe. 02/15/2015  . Cannabis use disorder severe. 02/15/2015  . Schizophrenia (HCC) 02/14/2015    Past Surgical History  Procedure Laterality Date  . Cochlear implant      No current outpatient prescriptions on file.  Allergies Review of patient's allergies indicates no known allergies.  No family history on file.  Social History Social History  Substance Use Topics  . Smoking status: Current Every Day Smoker -- 0.25 packs/day    Types: Cigarettes  . Smokeless tobacco: None  . Alcohol Use: No  Denies alcohol or illicit drugs  Review of Systems Constitutional: No feverENT: No uri Cardiovascular: Denies chest pain. Respiratory: Denies shortness of breath. Gastrointestinal: No abdominal pain.   Musculoskeletal: Negative for back pain. Skin: Negative for rash. Neurological: Negative for headaches 10-point ROS otherwise  negative.  ____________________________________________   PHYSICAL EXAM:  VITAL SIGNS: ED Triage Vitals  Enc Vitals Group     BP 01/08/16 1604 132/89 mmHg     Pulse Rate 01/08/16 1604 76     Resp 01/08/16 1604 18     Temp 01/08/16 1604 98.5 F (36.9 C)     Temp Source 01/08/16 1604 Oral     SpO2 01/08/16 1604 95 %     Weight 01/08/16 1604 119 lb (53.978 kg)     Height 01/08/16 1604  (1.651 m)     Head Cir --      Peak Flow --      Pain Score 01/08/16 1605 0     Pain Loc --      Pain Edu? --      Excl. in GC? --    Constitutional: Alert and oriented. Well appearing and in no acute distress. Eyes: Conjunctivae are normal. PERRL. EOMI. Head: Atraumatic. Nose: No congestion/rhinnorhea. Mouth/Throat: Mucous membranes are moist.  Oropharynx non-erythematous. Neck: No stridor.   Cardiovascular: Normal rate, regular rhythm. Grossly normal heart sounds.  Good peripheral circulation. Respiratory: Normal respiratory effort.  No retractions. Lungs CTAB. Gastrointestinal: Soft and nontender. No distention. No abdominal bruits. No CVA tenderness. Musculoskeletal: No lower extremity tenderness nor edema.   Neurologic:  Normal speech and language. No gross focal neurologic deficits are appreciated.  Skin:  Skin is warm, dry and intact. No rash noted. Psychiatric: Affect is flat, eye contact is poor  ____________________________________________   LABS (all labs ordered are listed, but only abnormal results are displayed)  Labs  Reviewed  COMPREHENSIVE METABOLIC PANEL - Abnormal; Notable for the following:    Glucose, Bld 107 (*)    All other components within normal limits  ACETAMINOPHEN LEVEL - Abnormal; Notable for the following:    Acetaminophen (Tylenol), Serum <10 (*)    All other components within normal limits  URINE DRUG SCREEN, QUALITATIVE ([facility] ONLY) - Abnormal; Notable for the following:    Cocaine Metabolite,Ur [place] POSITIVE (*)    Cannabinoid 50 Ng, Ur [place] POSITIVE  (*)    All other components within normal limits  ETHANOL  SALICYLATE LEVEL  CBC   ____________________________________________  ____________________________________________   INITIAL IMPRESSION / ASSESSMENT AND PLAN / ED COURSE  Pertinent labs & imaging results that were available during my care of the patient were reviewed by me and considered in my medical decision making (see chart for details).  Pt on IVC by police; I completed MD exam.  Pt is medically cleared for psychiatric evaluation ____________________________________________   FINAL CLINICAL IMPRESSION(S) / ED DIAGNOSES  Final diagnoses:  Suicidal ideation      New prescriptions started this visit New Prescriptions   No medications on file     Maurilio LovelyNoelle Seferina Brokaw, MD 01/09/16 0003

## 2016-01-08 NOTE — ED Notes (Signed)

## 2016-01-08 NOTE — ED Notes (Signed)
BEHAVIORAL HEALTH ROUNDING Patient sleeping: No. Patient alert and oriented: yes Behavior appropriate: Yes.  ; If no, describe:  Nutrition and fluids offered: yes Toileting and hygiene offered: Yes  Sitter present: q15 minute observations and security camera monitoring Law enforcement present: Yes  ODS  

## 2016-01-08 NOTE — ED Notes (Signed)
Supper provided along with an extra drink   

## 2016-01-08 NOTE — Progress Notes (Signed)
This Clinical research associatewriter consulted with Dr. Toni Amendlapacs it is recommended to refer for inpatient treatment.       Maryelizabeth Rowanressa Geary Rufo, MSW, Clare CharonLCSW, LCAS [facility] Triage Specialist [phone]
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
## 2016-01-08 NOTE — ED Notes (Signed)
Patient presents to the ED with suicidal ideation.  Patient reports plan to kill himself by shooting himself.  Patient bought a gun today in order to commit suicide.  Patient reports hearing voices in his head that he wanted to make stop.  Patient denies having any psychiatric assistance in the past few years.  Patient reports history of schizophrenia.  Patient denies history of suicide attempt.  Patient's affect is flat and calm.  Patient is cooperative at this time.

## 2016-01-08 NOTE — ED Notes (Signed)
Per Dr. Glenetta HewMcLaurin, pt okay to have q 15 min observations. 1:1 sitter order discontinued. Pt verbalized contract for safety.

## 2016-01-09 ENCOUNTER — Inpatient Hospital Stay
Admission: RE | Admit: 2016-01-09 | Discharge: 2016-01-21 | DRG: 885 | Disposition: A | Discharge status: Home / Self Care | Payer: Medicaid Other | Source: Intra-hospital | Attending: Psychiatry | Admitting: Psychiatry

## 2016-01-09 DIAGNOSIS — F1220 Cannabis dependence, uncomplicated: Secondary | ICD-10-CM | POA: Diagnosis present

## 2016-01-09 DIAGNOSIS — F17210 Nicotine dependence, cigarettes, uncomplicated: Secondary | ICD-10-CM | POA: Diagnosis present

## 2016-01-09 DIAGNOSIS — Q899 Congenital malformation, unspecified: Secondary | ICD-10-CM

## 2016-01-09 DIAGNOSIS — K117 Disturbances of salivary secretion: Secondary | ICD-10-CM | POA: Diagnosis present

## 2016-01-09 DIAGNOSIS — F1410 Cocaine abuse, uncomplicated: Secondary | ICD-10-CM | POA: Diagnosis present

## 2016-01-09 DIAGNOSIS — Z9119 Patient's noncompliance with other medical treatment and regimen: Secondary | ICD-10-CM

## 2016-01-09 DIAGNOSIS — F203 Undifferentiated schizophrenia: Principal | ICD-10-CM | POA: Diagnosis present

## 2016-01-09 DIAGNOSIS — F489 Nonpsychotic mental disorder, unspecified: Secondary | ICD-10-CM

## 2016-01-09 DIAGNOSIS — R45851 Suicidal ideations: Secondary | ICD-10-CM | POA: Diagnosis present

## 2016-01-09 DIAGNOSIS — G4700 Insomnia, unspecified: Secondary | ICD-10-CM | POA: Diagnosis present

## 2016-01-09 DIAGNOSIS — Z9114 Patient's other noncompliance with medication regimen: Secondary | ICD-10-CM

## 2016-01-09 DIAGNOSIS — F17200 Nicotine dependence, unspecified, uncomplicated: Secondary | ICD-10-CM | POA: Diagnosis present

## 2016-01-09 DIAGNOSIS — Z9621 Cochlear implant status: Secondary | ICD-10-CM | POA: Diagnosis present

## 2016-01-09 DIAGNOSIS — F1210 Cannabis abuse, uncomplicated: Secondary | ICD-10-CM | POA: Diagnosis present

## 2016-01-09 LAB — LIPID PANEL
CHOL/HDL RATIO: 4.6 ratio
Cholesterol: 184 mg/dL (ref 0–200)
HDL: 40 mg/dL — AB (ref >40)
LDL Cholesterol: 117 mg/dL — ABNORMAL HIGH (ref 0–99)
Triglycerides: 136 mg/dL (ref <150)
VLDL: 27 mg/dL (ref 0–40)

## 2016-01-09 LAB — HEMOGLOBIN A1C: HEMOGLOBIN A1C: 5.2 % (ref 4.0–6.0)

## 2016-01-09 LAB — TSH: TSH: 1.689 u[IU]/mL (ref 0.350–4.500)

## 2016-01-09 MED ORDER — ALUM & MAG HYDROXIDE-SIMETH 200-200-20 MG/5ML PO SUSP
30.0000 mL | ORAL | Status: DC | PRN
  Administered 2016-01-16: 30 mL via ORAL
  Filled 2016-01-09: qty 30

## 2016-01-09 MED ORDER — ARIPIPRAZOLE 10 MG PO TABS
ORAL_TABLET | ORAL | Status: AC
  Filled 2016-01-09: qty 2

## 2016-01-09 MED ORDER — BENZTROPINE MESYLATE 1 MG PO TABS: 1.0000 mg | ORAL_TABLET | Freq: Four times a day (QID) | ORAL | Status: DC | PRN

## 2016-01-09 MED ORDER — ARIPIPRAZOLE 10 MG PO TABS
20.0000 mg | ORAL_TABLET | Freq: Every day | ORAL | Status: DC
  Administered 2016-01-10 – 2016-01-15 (×6): 20 mg via ORAL
  Filled 2016-01-09 (×6): qty 2

## 2016-01-09 MED ORDER — ACETAMINOPHEN 325 MG PO TABS
650.0000 mg | ORAL_TABLET | Freq: Four times a day (QID) | ORAL | Status: DC | PRN
  Administered 2016-01-20: 650 mg via ORAL
  Filled 2016-01-09: qty 2

## 2016-01-09 MED ORDER — MAGNESIUM HYDROXIDE 400 MG/5ML PO SUSP: 30.0000 mL | Freq: Every day | ORAL | Status: DC | PRN

## 2016-01-09 MED ORDER — ARIPIPRAZOLE 10 MG PO TABS
20.0000 mg | ORAL_TABLET | Freq: Every day | ORAL | Status: DC
  Administered 2016-01-09: 20 mg via ORAL

## 2016-01-09 NOTE — ED Notes (Signed)
Patient noted in room. No complaints, stable, in no acute distress. Q15 minute rounds and monitoring via Security Cameras to continue.  

## 2016-01-09 NOTE — [facility] Group Notes (Signed)
[facility] Group Notes:  (Nursing/MHT/Case Management/Adjunct)  Date:  01/09/2016  Time:  3:54 PM  Type of Therapy:  Movement Therapy  Participation Level:  Did Not Attend  Hudson Lehmkuhl De'Chelle Nahia Nissan 01/09/2016, 3:54 PM

## 2016-01-09 NOTE — ED Notes (Signed)
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 Patient encouraged to try to get job again when He is discharged from the hospital. Patient states that He does have supportive family. Family encouraged to ask nurse if He has any concerns or needs.

## 2016-01-09 NOTE — Progress Notes (Signed)
25 yrs old male admitted with schizophrenia & suicidal ideations.Skin assessment & body search done,no contraband found.Denies suicidal ideations.Oriented to unit & made comfortable in room.

## 2016-01-09 NOTE — ED Notes (Signed)
Patient alert and oriented, patient denies Si/Hi, patient is pleasant and cooperative. Patient with q 15 min. Checks, and camera monitoring in progress.

## 2016-01-09 NOTE — ED Notes (Signed)
Patient oriented, [facility], Patient is calm and cooperative, denies Si, but does states that He still hears voices. Patient with q 15 min. Checks, and camera monitoring in progress.

## 2016-01-09 NOTE — ED Notes (Signed)
Patient brushed His teeth and gave back toothbrush.

## 2016-01-09 NOTE — Consult Note (Signed)
[place] [facility] Consult   Reason for Consult:  Consult for this 25 year old man with a history of schizophrenia brought into the hospital by law enforcement after attempting to shoot himself Referring Physician:  Quale Patient Identification: Justin Schaefer MRN:  [id number] Principal Diagnosis: Suicidal behavior Diagnosis:   Patient Active Problem List   Diagnosis Date Noted  . Suicidal behavior [F48.9] 01/09/2016  . Cocaine abuse [F14.10] 01/09/2016  . Undifferentiated schizophrenia ([place]) [F20.3]   . Tobacco use disorder severe. [F17.200] 02/15/2015  . Cannabis use disorder severe. [F12.10] 02/15/2015  . Schizophrenia ([place]) [F20.9] 02/14/2015    Total Time spent with patient: 1 hour  Subjective:   Justin Schaefer is a 25 y.o. male patient admitted with "I kind of tried to kill myself".  HPI:  Patient interviewed. Chart reviewed. Labs reviewed and notes reviewed. Old chart reviewed. 25 year old man brought in by law enforcement because of suicide attempt. Patient says that yesterday he was driving around and went to a store and purchased a rifle. He said he went somewhere where he planned to shoot himself. He fired it off a couple times to be sure that he knew how it worked. He pointed at himself a couple times but couldn't actually bring himself to shoot himself. By that time police showed up because neighbors had heard the gunshots. Patient is not a very clear historian but does say that he wanted to kill himself because people were bothering him. His description of "people bothering him" is that he hears voices talking to him sometimes he can understand them and sometimes he can't. He says several other rather disorganized things trying to express how unhappy he is but has a hard time putting context to it. He does not think it's necessarily getting worse but is about the same as it has been. He denies problems with sleep or appetite. He denies medical problems. The patient  has not been going for any outpatient psychiatric treatment since he was here last summer. He denies that he's been drinking or abusing any alcohol or drugs but his urine drug screen is positive for cannabis and cocaine. Patient denies any thoughts of hurting anyone else.  Social history: Lives with his brother. 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 Not really employed. He says he did get disability and does have Medicaid. Stays in touch with his brother and his father is his closest relatives.  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  Substance abuse history: Has had a past history of using cannabis but I believe this is the first time he's been identified as possibly also using cocaine. Patient minimizes it even when I ask him several times he denies that he's been using any drugs.  Past Psychiatric History: One prior psychiatric hospitalization at our facility about a year ago. At that time he eventually responded to Abilify and was started on long-acting injectable medicine. There is a report that he had had a previous trial of Risperdal without benefit as well. No known prior suicide attempts. Prior diagnosis of schizophrenia.  Risk to Self: Suicidal Ideation: Yes-Currently Present (Pt brought a gun today to kill himself. Pt currently denies.) Suicidal Intent: Yes-Currently Present Is patient at risk for suicide?: Yes Suicidal Plan?: Yes-Currently Present Specify Current Suicidal  Plan: shoot self Access to Means: Yes Specify Access to Suicidal Means: Pt reports buying a gun today to shoot self What has been your use of drugs/alcohol within the last 12 months?: Pt denies How many times?: 0 Triggers for Past Attempts: Hallucinations Intentional Self Injurious Behavior: None Risk to  Others: Homicidal Ideation: No-Not Currently/Within Last 6 Months (Pt denies) Thoughts of Harm to Others: No-Not Currently Present/Within Last 6 Months Current Homicidal Intent: No-Not Currently/Within Last 6 Months Current Homicidal Plan: No-Not Currently/Within Last 6 Months Access to Homicidal Means: No History of harm to others?: No Assessment of Violence: None Noted Does patient have access to weapons?: Yes (Comment) (Pt brought a gun today to kill self) Criminal Charges Pending?: No Does patient have a court date: No Prior Inpatient Therapy: Prior Inpatient Therapy: Yes Prior Therapy Dates: April 2016 Prior Therapy Facilty/Provider(s): [facility] Reason for Treatment: Psychosis Prior Outpatient Therapy: Prior Outpatient Therapy: No Does patient have an ACCT team?: No Does patient have Intensive In-House Services?  : No Does patient have [facility] services? : No Does patient have P4CC services?: No  Past Medical History:  Past Medical History  Diagnosis Date  . 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 Schizophrenia [facility])     Past Surgical History  Procedure Laterality Date  . Cochlear implant     Family History: No family history on file. Family Psychiatric  History: Patient denies knowing of any family history of mental health or substance abuse problems Social History:  History  Alcohol Use No     History  Drug Use  . Yes  . Special: Marijuana    Social History   Social History  . Marital Status: Single    Spouse Name: N/A  . Number of Children: N/A  . Years of Education: N/A   Social History Main Topics  . Smoking status: Current Every Day Smoker -- 0.25 packs/day    Types: Cigarettes  . Smokeless tobacco: None  . Alcohol Use: No  . Drug Use: Yes    Special: Marijuana  . Sexual Activity: No   Other Topics Concern  . None   Social History Narrative   Additional Social History:    Allergies:  No Known Allergies  Labs:  Results for orders placed or  performed during the hospital encounter of 01/08/16 (from the past 48 hour(s))  Comprehensive metabolic panel     Status: Abnormal   Collection Time: 01/08/16  4:06 PM  Result Value Ref Range   Sodium 137 135 - 145 mmol/L   Potassium 4.3 3.5 - 5.1 mmol/L   Chloride 104 101 - 111 mmol/L   CO2 24 22 - 32 mmol/L   Glucose, Bld 107 (H) 65 - 99 mg/dL   BUN 11 6 - 20 mg/dL   Creatinine, Ser 1.03 0.61 - 1.24 mg/dL   Calcium 9.1 8.9 - 10.3 mg/dL   Total Protein 7.6 6.5 - 8.1 g/dL   Albumin 4.5 3.5 - 5.0 g/dL   AST 25 15 - 41 U/L   ALT 17 17 - 63 U/L   Alkaline Phosphatase 63 38 - 126 U/L   Total Bilirubin 0.8 0.3 - 1.2 mg/dL   GFR calc non Af Amer >60 >60 mL/min   GFR calc Af Amer >60 >60 mL/min    Comment: (NOTE) The eGFR has been calculated using the CKD EPI equation. This calculation has not been validated in all clinical situations. eGFR's persistently <60 mL/min signify possible Chronic Kidney Disease.  Anion gap 9 5 - 15  Ethanol (ETOH)     Status: None   Collection Time: 01/08/16  4:06 PM  Result Value Ref Range   Alcohol, Ethyl (B) <5 <5 mg/dL    Comment:        LOWEST DETECTABLE LIMIT FOR SERUM ALCOHOL IS 5 mg/dL FOR MEDICAL PURPOSES ONLY   Salicylate level     Status: None   Collection Time: 01/08/16  4:06 PM  Result Value Ref Range   Salicylate Lvl <8.6 2.8 - 30.0 mg/dL  Acetaminophen level     Status: Abnormal   Collection Time: 01/08/16  4:06 PM  Result Value Ref Range   Acetaminophen (Tylenol), Serum <10 (L) 10 - 30 ug/mL    Comment:        THERAPEUTIC CONCENTRATIONS VARY SIGNIFICANTLY. A RANGE OF 10-30 ug/mL MAY BE AN EFFECTIVE CONCENTRATION FOR MANY PATIENTS. HOWEVER, SOME ARE BEST TREATED AT CONCENTRATIONS OUTSIDE THIS RANGE. ACETAMINOPHEN CONCENTRATIONS >150 ug/mL AT 4 HOURS AFTER INGESTION AND >50 ug/mL AT 12 HOURS AFTER INGESTION ARE OFTEN ASSOCIATED WITH TOXIC REACTIONS.   CBC     Status: None   Collection Time: 01/08/16  4:06 PM  Result  Value Ref Range   WBC 9.1 3.8 - 10.6 K/uL   RBC 5.65 4.40 - 5.90 MIL/uL   Hemoglobin 16.5 13.0 - 18.0 g/dL   HCT 48.9 40.0 - 52.0 %   MCV 86.6 80.0 - 100.0 fL   MCH 29.2 26.0 - 34.0 pg   MCHC 33.7 32.0 - 36.0 g/dL   RDW 12.9 11.5 - 14.5 %   Platelets 166 150 - 440 K/uL  Urine Drug Screen, Qualitative ([facility] only)     Status: Abnormal   Collection Time: 01/08/16 11:39 PM  Result Value Ref Range   Tricyclic, Ur Screen NONE DETECTED NONE DETECTED   Amphetamines, Ur Screen NONE DETECTED NONE DETECTED   MDMA (Ecstasy)Ur Screen NONE DETECTED NONE DETECTED   Cocaine Metabolite,Ur [place] POSITIVE (A) NONE DETECTED   Opiate, Ur Screen NONE DETECTED NONE DETECTED   Phencyclidine (PCP) Ur S NONE DETECTED NONE DETECTED   Cannabinoid 50 Ng, Ur [place] POSITIVE (A) NONE DETECTED   Barbiturates, Ur Screen NONE DETECTED NONE DETECTED   Benzodiazepine, Ur Scrn NONE DETECTED NONE DETECTED   Methadone Scn, Ur NONE DETECTED NONE DETECTED    Comment: (NOTE) 381  Tricyclics, urine               Cutoff 1000 ng/mL 200  Amphetamines, urine             Cutoff 1000 ng/mL 300  MDMA (Ecstasy), urine           Cutoff 500 ng/mL 400  Cocaine Metabolite, urine       Cutoff 300 ng/mL 500  Opiate, urine                   Cutoff 300 ng/mL 600  Phencyclidine (PCP), urine      Cutoff 25 ng/mL 700  Cannabinoid, urine              Cutoff 50 ng/mL 800  Barbiturates, urine             Cutoff 200 ng/mL 900  Benzodiazepine, urine           Cutoff 200 ng/mL 1000 Methadone, urine                Cutoff 300 ng/mL 1100 1200 The urine drug screen provides only a  preliminary, unconfirmed 1300 analytical test result and should not be used for non-medical 1400 purposes. Clinical consideration and professional judgment should 1500 be applied to any positive drug screen result due to possible 1600 interfering substances. A more specific alternate chemical method 1700 must be used in order to obtain a confirmed analytical result.  1800 Gas  chromato graphy / mass spectrometry (GC/MS) is the preferred 1900 confirmatory method.     No current facility-administered medications for this encounter.   No current outpatient prescriptions on file.    Musculoskeletal: Strength & Muscle Tone: within normal limits Gait & Station: normal Patient leans: N/A  Psychiatric Specialty Exam: Review of Systems  Constitutional: Negative.   HENT: Negative.   Eyes: Negative.   Respiratory: Negative.   Cardiovascular: Negative.   Gastrointestinal: Negative.   Musculoskeletal: Negative.   Skin: Negative.   Neurological: Negative.   Psychiatric/Behavioral: Positive for depression, suicidal ideas, hallucinations and substance abuse. Negative for memory loss. The patient is nervous/anxious. The patient does not have insomnia.     Blood pressure 105/54, pulse 86, temperature 97.9 F (36.6 C), temperature source Oral, resp. rate 20, height _0  (1.651 m), weight 53.978 kg (119 lb), SpO2 97 %.Body mass index is 19.8 kg/(m^2).  General Appearance: Disheveled  Eye Contact::  Minimal  Speech:  Slow  Volume:  Normal  Mood:  Dysphoric  Affect:  Flat  Thought Process:  Goal Directed  Orientation:  Full (Time, Place, and Person)  Thought Content:  Hallucinations: Auditory  Suicidal Thoughts:  Yes.  with intent/plan  Homicidal Thoughts:  No  Memory:  Immediate;   Good Recent;   Fair Remote;   Fair  Judgement:  Impaired  Insight:  Lacking  Psychomotor Activity:  Psychomotor Retardation  Concentration:  Poor  Recall:  [place] of Knowledge:Fair  Language: Fair  Akathisia:  No  Handed:  Right  AIMS (if indicated):     Assets:  Financial Resources/Insurance Housing Physical Health Resilience  ADL's:  Intact  Cognition: WNL  Sleep:      Treatment Plan Summary: Daily contact with patient to assess and evaluate symptoms and progress in treatment, Medication management and Plan 25 year old man with a history of schizophrenia with a  serious suicide attempt yesterday. Actively psychotic with hallucinations and disorganized thinking. Blunted affect. Poor functioning no outpatient treatment. Clearly needs inpatient hospital level treatment. Patient is under IVC. Plan will be to admit him to the inpatient psychiatry ward. Continuous observation for suicidality. I will restart the Abilify that was helpful for him in the past. Will be receiving daily individual and group counseling about mental health and substance abuse issues.  Disposition: Recommend psychiatric Inpatient admission when medically cleared. Supportive therapy provided about ongoing stressors.  Alethia Berthold, MD 01/09/2016 2:24 PM

## 2016-01-09 NOTE — ED Notes (Signed)
[facility] in [facility], no s/s of distress. Patient's belongings taken with him. Patient is cooperative, stable.

## 2016-01-09 NOTE — ED Notes (Signed)
Patient oriented to unit with no complaints.  Patient endorses SI with a plan.  Patient currently denies AH.

## 2016-01-09 NOTE — [facility] Group Notes (Signed)
[facility] Group Notes:  (Nursing/MHT/Case Management/Adjunct)  Date:  01/09/2016  Time:  11:39 PM  Type of Therapy:  Evening Wrap-up Group  Participation Level:  Minimal  Participation Quality:  Appropriate  Affect:  Appropriate  Cognitive:  Appropriate  Insight:  Appropriate  Engagement in Group:  Developing/Improving  Modes of Intervention:  Discussion  Summary of Progress/Problems: Patient did not share much but states his day was decent.   Carlissa Pesola Nanta Kathalina Ostermann 01/09/2016, 11:39 PM

## 2016-01-09 NOTE — ED Notes (Signed)
Patient alert and oriented, has only wanted to sleep most of the day, has fair appetite, no s/s of distress. Patient denies Si/Hi. Patient does have flat affect,states that He still hears voices.

## 2016-01-09 NOTE — ED Notes (Signed)
Dr. Clapacs talking with Patient.  

## 2016-01-09 NOTE — Tx Team (Signed)
Initial Interdisciplinary Treatment Plan   PATIENT STRESSORS: Medication change or noncompliance Substance abuse   PATIENT STRENGTHS: Communication skills Supportive family/friends   PROBLEM LIST: Problem List/Patient Goals Date to be addressed Date deferred Reason deferred Estimated date of resolution  Suicidal ideation 01/09/2016     Schizophrenia 01/09/2016                                                DISCHARGE CRITERIA:  Adequate post-discharge living arrangements Reduction of life-threatening or endangering symptoms to within safe limits Verbal commitment to aftercare and medication compliance  PRELIMINARY DISCHARGE PLAN: Attend aftercare/continuing care group Return to previous living arrangement Return to previous work or school arrangements  PATIENT/FAMIILY INVOLVEMENT: This treatment plan has been presented to and reviewed with the patient, Justin BostonCarlos E Reasons, and/or family member,  The patient and family have been given the opportunity to ask questions and make suggestions.  Margo CommonGigi George [name] 01/09/2016, 6:09 PM

## 2016-01-09 NOTE — ED Notes (Signed)
Lunch tray given to patient

## 2016-01-09 NOTE — ED Provider Notes (Signed)
-----------------------------------------   7:17 AM on 01/09/2016 -----------------------------------------   Blood pressure 105/54, pulse 86, temperature 97.9 F (36.6 C), temperature source Oral, resp. rate 20, height 5\' 5"  (1.651 m), weight 119 lb (53.978 kg), SpO2 97 %.  The patient had no acute events since last update.  Calm and cooperative at this time.  Disposition is pending per Psychiatry/Behavioral Medicine team recommendations.     Irean HongJade J Camarie Mctigue, MD 01/09/16 [phone]

## 2016-01-09 NOTE — ED Notes (Signed)
[facility]. Patient is aware and cooperative.

## 2016-01-09 NOTE — ED Notes (Signed)
Nurse gave patient His breakfast tray, He ask for toothbrush.

## 2016-01-10 ENCOUNTER — Encounter: Payer: Self-pay | Admitting: Psychiatry

## 2016-01-10 DIAGNOSIS — F203 Undifferentiated schizophrenia: Principal | ICD-10-CM

## 2016-01-10 DIAGNOSIS — F1220 Cannabis dependence, uncomplicated: Secondary | ICD-10-CM

## 2016-01-10 DIAGNOSIS — F1410 Cocaine abuse, uncomplicated: Secondary | ICD-10-CM

## 2016-01-10 LAB — PROLACTIN: Prolactin: 4.4 ng/mL (ref 4.0–15.2)

## 2016-01-10 MED ORDER — ARIPIPRAZOLE ER 400 MG IM SUSR
400.0000 mg | INTRAMUSCULAR | Status: DC
  Administered 2016-01-10: 400 mg via INTRAMUSCULAR
  Filled 2016-01-10 (×2): qty 400

## 2016-01-10 MED ORDER — NICOTINE 14 MG/24HR TD PT24
14.0000 mg | MEDICATED_PATCH | Freq: Every day | TRANSDERMAL | Status: DC
  Administered 2016-01-11 – 2016-01-20 (×10): 14 mg via TRANSDERMAL
  Filled 2016-01-10 (×11): qty 1

## 2016-01-10 MED ORDER — TRAZODONE HCL 100 MG PO TABS
100.0000 mg | ORAL_TABLET | Freq: Every evening | ORAL | Status: DC | PRN
  Administered 2016-01-12 – 2016-01-13 (×2): 100 mg via ORAL
  Filled 2016-01-10 (×2): qty 1

## 2016-01-10 NOTE — [facility] Group Notes (Signed)
[facility] Group Notes:  (Nursing/MHT/Case Management/Adjunct)  Date:  01/10/2016  Time:  4:06 PM  Type of Therapy:  Group Therapy  Participation Level:  Minimal  Participation Quality:  Appropriate  Affect:  Blunted  Cognitive:  Appropriate  Insight:  Limited  Engagement in Group:  None  Modes of Intervention:  Support  Summary of Progress/Problems:  Justin Schaefer 01/10/2016, 4:06 PM

## 2016-01-10 NOTE — Progress Notes (Signed)
[facility]  01/10/2016 7:49 PM Justin BostonCarlos E Schaefer  MRN:  [id number]  Subjective:  Mr. Justin Schaefer reports some improvement. He is not as paranoid and seems more relaxed today. He still feels depressed and has passing suicidal thoughts. He accepted Abilify and seems to tolerate it well. He improved greatly on Abilify during his last admission but did not follow-up in the community. He now tells me that he would take his medication. He is very quiet on the unit. He tries to participate in programming. Sleep and appetite are fair.  Principal Problem: Undifferentiated schizophrenia (HCC) Diagnosis:   Patient Active Problem List   Diagnosis Date Noted  . Tobacco use disorder severe. [F17.200] 02/15/2015    Priority: Medium  . Cannabis use disorder severe. [F12.10] 02/15/2015    Priority: Medium  . Cocaine use disorder, mild, abuse [F14.10] 01/10/2016  . Cannabis use disorder, moderate, dependence (HCC) [F12.20] 01/10/2016  . Undifferentiated schizophrenia (HCC) [F20.3]    Total Time spent with patient: 20 minutes  Past Psychiatric History: Schizophrenia.  Past Medical History:  Past Medical History  Diagnosis Date  . 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 Schizophrenia [facility](HCC)     Past Surgical History  Procedure Laterality Date  . Cochlear implant     Family History: History reviewed. No pertinent family history. Family Psychiatric  History: See H&P. Social History:  History  Alcohol Use No     History  Drug Use  . Yes  . Special: Marijuana    Social History   Social History  . Marital Status: Single    Spouse Name: N/A  . Number of Children: N/A  . Years of Education: N/A   Social History Main Topics  . Smoking status: Current Every Day Smoker -- 0.25 packs/day    Types: Cigarettes  . Smokeless tobacco: None  . Alcohol Use: No  . Drug Use: Yes    Special: Marijuana  . Sexual Activity: No   Other Topics Concern  . None   Social History Narrative    Additional Social History:    History of alcohol / drug use?: Yes                    Sleep: Fair  Appetite:  Fair  Current Medications: Current Facility-Administered Medications  Medication Dose Route Frequency Provider Last Rate Last Dose  . acetaminophen (TYLENOL) tablet 650 mg  650 mg Oral Q6H PRN Audery AmelJohn T Clapacs, MD      . alum & mag hydroxide-simeth (MAALOX/MYLANTA) 200-200-20 MG/5ML suspension 30 mL  30 mL Oral Q4H PRN Audery AmelJohn T Clapacs, MD      . ARIPiprazole (ABILIFY) tablet 20 mg  20 mg Oral Daily Audery AmelJohn T Clapacs, MD   20 mg at 01/10/16 1018  . ARIPiprazole SUSR 400 mg  400 mg Intramuscular Q30 days Jimmy FootmanAndrea Hernandez-Gonzalez, MD   400 mg at 01/10/16 1726  . magnesium hydroxide (MILK OF MAGNESIA) suspension 30 mL  30 mL Oral Daily PRN Audery AmelJohn T Clapacs, MD      . nicotine (NICODERM CQ - dosed in mg/24 hours) patch 14 mg  14 mg Transdermal Daily Jimmy FootmanAndrea Hernandez-Gonzalez, MD   14 mg at 01/10/16 1015  . traZODone (DESYREL) tablet 100 mg  100 mg Oral QHS PRN Jimmy FootmanAndrea Hernandez-Gonzalez, MD        Lab Results:  Results for orders placed or performed during the hospital encounter of 01/09/16 (from the past 48 hour(s))  Hemoglobin A1c  Status: None   Collection Time: 01/09/16  5:03 PM  Result Value Ref Range   Hgb A1c MFr Bld 5.2 4.0 - 6.0 %  Lipid panel, fasting     Status: Abnormal   Collection Time: 01/09/16  5:03 PM  Result Value Ref Range   Cholesterol 184 0 - 200 mg/dL   Triglycerides 161 <096 mg/dL   HDL 40 (L) >04 mg/dL   Total CHOL/HDL Ratio 4.6 RATIO   VLDL 27 0 - 40 mg/dL   LDL Cholesterol 540 (H) 0 - 99 mg/dL    Comment:        Total Cholesterol/HDL:CHD Risk Coronary Heart Disease Risk Table                     Men   Women  1/2 Average Risk   3.4   3.3  Average Risk       5.0   4.4  2 X Average Risk   9.6   7.1  3 X Average Risk  23.4   11.0        Use the calculated Patient Ratio above and the CHD Risk Table to determine the patient's CHD Risk.         ATP III CLASSIFICATION (LDL):  <100     mg/dL   Optimal  981-191  mg/dL   Near or Above                    Optimal  130-159  mg/dL   Borderline  478-295  mg/dL   High  >621     mg/dL   Very High   Prolactin     Status: None   Collection Time: 01/09/16  5:03 PM  Result Value Ref Range   Prolactin 4.4 4.0 - 15.2 ng/mL    Comment: (NOTE) Performed At: [facility] [address] [place], [place] [id number] Mila Homer MD [id number]   TSH     Status: None   Collection Time: 01/09/16  5:03 PM  Result Value Ref Range   TSH 1.689 0.350 - 4.500 uIU/mL    Blood Alcohol level:  Lab Results  Component Value Date   ETH <5 01/08/2016   ETH <5 02/13/2015    Physical Findings: AIMS:  , ,  ,  , Dental Status Current problems with teeth and/or dentures?: No Does patient usually wear dentures?: No  CIWA:    COWS:     Musculoskeletal: Strength & Muscle Tone: within normal limits Gait & Station: normal Patient leans: N/A  Psychiatric Specialty Exam: Review of Systems  Psychiatric/Behavioral: Positive for depression, suicidal ideas and hallucinations.  All other systems reviewed and are negative.   Blood pressure 148/78, pulse 80, temperature 98.4 F (36.9 C), temperature source Oral, resp. rate 20, height  (1.626 m), weight 55.339 kg (122 lb), SpO2 99 %.Body mass index is 20.93 kg/(m^2).  General Appearance: Casual  Eye Contact::  Fair  Speech:  Clear and Coherent  Volume:  Normal  Mood:  Depressed, Hopeless and Worthless  Affect:  Blunt  Thought Process:  Goal Directed  Orientation:  Full (Time, Place, and Person)  Thought Content:  Delusions and Paranoid Ideation  Suicidal Thoughts:  Yes.  with intent/plan  Homicidal Thoughts:  No  Memory:  Immediate;   Fair Recent;   Fair Remote;   Fair  Judgement:  Poor  Insight:  Lacking  Psychomotor Activity:  Decreased  Concentration:  Fair  Recall:  Fair  Fund of Knowledge:Fair  Language: Fair   Akathisia:  No  Handed:  Right  AIMS (if indicated):     Assets:  Communication Skills Desire for Improvement Financial Resources/Insurance Housing Physical Health Resilience Social Support  ADL's:  Intact  Cognition: WNL  Sleep:  Number of Hours: 8   Treatment Plan Summary: Daily contact with patient to assess and evaluate symptoms and progress in treatment and Medication management   Mr. Bognar is a 25 year old male with a history of schizophrenia admitted for worsening of depression and suicide attempt by a gun and provoking suicide by police.   1. Suicidal ideation. The patient is able to contract for safety.  2. Mood and psychosis. He accepted oral Abilify and Abilify Maintena injection for psychosis .   3. Insomnia. He is on trazodone.   4.  Smoking. He is on nicotine patch.   5. Substance abuse. The patient minimizes his problems and is not interested in substance abuse treatment at this point.   6. Precautions every 15 minute checks.  7. Hospitalization status patient will sign in as voluntary today.I wonder if this is a good decision since the patient was able to purchase a gun. If committed properly he will be prohibited from buying a gun legally.   8. Diet regular  9. Discharge disposition patient will return to his home in [place] where he lives with his brother. He may be moving in with his father.   10. Discharge follow-up: Patient reports having Medicaid and receives disability. Patient will need to be connected with [facility] were he should be referred for community support team.    no medication changes were offered on 01/11/2016.  Kristine Linea, MD 01/10/2016, 7:49 PM

## 2016-01-10 NOTE — Progress Notes (Signed)
Patient is calm & pleasant.Denies depression & suicidal ideations.Appropriate with staff & peers.Visible in the milieu.Minimal interactions with peers.Appropriate with staff & peers.Compliant with medications.Appetite & energy level good.

## 2016-01-10 NOTE — Progress Notes (Signed)
Recreation Therapy Notes  Date: 04.07.17 Time: 9:30 am Location: Craft Room  Group Topic: Coping Skills  Goal Area(s) Addresses:  Patient will effectively use art as a Associate Professorcoping skill. Patient will be able to identify one emotion experienced during group session. Patient will identify use of art as a coping skill.  Behavioral Response: Attentive  Intervention: Two Faces of Me  Activity: Patients were given a blank face worksheet and instructed to draw a line down the middle. On one side, patients were instructed to draw or write how they felt when they were admitted to the hospital and draw or write how they want to feel on the other side of the face.  Education: LRT educated patients on healthy coping skills.  Education Outcome: In group clarification offered  Clinical Observations/Feedback: Patient completed activity by writing how he felt when he was admitted to the hospital and writing how he wants to feel when he is d/c. Patient did not contribute to group discussion.  Jacquelynn CreeGreene,Marty Uy M, LRT/CTRS 01/10/2016 12:27 PM

## 2016-01-10 NOTE — H&P (Addendum)
Psychiatric Admission Assessment Adult  Patient Identification: Justin Schaefer MRN:  [id number] Date of Evaluation:  01/10/2016 Chief Complaint:  Schizophrenia Principal Diagnosis: Undifferentiated schizophrenia (HCC) Diagnosis:   Patient Active Problem List   Diagnosis Date Noted  . Cocaine use disorder, mild, abuse [F14.10] 01/10/2016  . Cannabis use disorder, moderate, dependence (HCC) [F12.20] 01/10/2016  . Undifferentiated schizophrenia (HCC) [F20.3]   . Tobacco use disorder severe. [F17.200] 02/15/2015  . Cannabis use disorder severe. [F12.10] 02/15/2015   History of Present Illness: Justin Schaefer is a 25 y.o. male with a history of schizophrenia who presented to the ER after he purchased a gun with intent of committing suicide; police were called by neighbors when patient fired his weapon 3 times. Per their IVC papers, when they arrived he raised his weapon at them and told them he did so in hopes that they would shoot him and kill him.  He stated he had these suicidal thoughts for "a while".   Urine toxicology screen was positive for cocaine, cannabis. Alcohol level was below the detection limit.  Two prior hospitalizations in our unit for psychosis. His last discharge was in the summer of 2006. Then he was treated with Abilify 10 mg oral and receive Abilify injectable. Patient reported that he has not taking any medications or follow-up with psychiatry since then.   Patient reported to the ER psychiatrist that he fired  the gun a couple times to be sure that he knew how it worked. He pointed at himself a couple times but couldn't actually bring himself to shoot himself. By that time police showed up because neighbors had heard the gunshots. [place] SpanielSaid he wanted to kill himself because people were bothering him. His description of "people bothering him" was that he hears voices talking to him sometimes he can understand them and sometimes he can't.  His thought process was described as  disorganized while in the emergency department.  Today the patient tells me he is not depressed or suicidal. He tells me he is still hearing voices that tell him random things but he was not able to give me an example. He said the voices sometimes tell him good things. Patient tells me he smokes marijuana about once a week. He says that prior to admission he tried cocaine for the first time.   He denies having any current stressors. He denies problems in the relationship with his family. The patient has been receiving disability for about a year. He tells me he spends most of his day playing games on his cell phone and sometimes he works in [employer] helping his father.  Patient [place] is originally [place]. He moved to [place] in his senior year of high school. He was living with his father and his father's girlfriend about about a year ago. For the last year he has been living with his younger brother who is 23 years old in [place].  Substance abuse history: Has had a past history of using cannabis but I believe this is the first time he's been identified as possibly also using cocaine. Patient minimizes it even when I ask him several times he denies that he's been using any drugs.  Gun has been removed by police.  Associated Signs/Symptoms: Depression Symptoms:  suicidal attempt, (Hypo) Manic Symptoms:  Denies Anxiety Symptoms:  Denies Psychotic Symptoms:  Hallucinations: Auditory PTSD Symptoms: NA Total Time spent with patient: 1 hour  Past Psychiatric History:  two prior psychiatric hospitalization at our  facility.  Last one  about a year ago. At that time he eventually responded to Abilify and was started on long-acting injectable medicine. There is a report that he had had a previous trial of Risperdal without benefit as well. No known prior suicide attempts. Prior diagnosis of schizophrenia.  Is the patient at risk to self? Yes.    Has the patient been a risk to self  in the past 6 months? No.  Has the patient been a risk to self within the distant past? No.  Is the patient a risk to others? No.  Has the patient been a risk to others in the past 6 months? No.  Has the patient been a risk to others within the distant past? No.   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 Schizophrenia [facility])     Past Surgical History  Procedure Laterality Date  . Cochlear implant     Family History:  Family Psychiatric  History: Patient denies knowing of any family history of mental health or substance abuse problems  Social History: Lives with his brother. 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 Not really employed. He says he did get disability and does have Medicaid. Stays in touch with his brother and his father is his closest relatives.  He denies any legal history.  As far as his education he completed high school. States that he repeated 12 grade.  Denies needing a special education and states he was in regular classes. Denies any behavioral problems while in school. History  Alcohol Use No     History  Drug Use  . Yes  . Special: Marijuana   Allergies:  No Known Allergies  Lab Results:  Results for orders placed or performed during the hospital encounter of 01/09/16 (from the past 48 hour(s))  Hemoglobin A1c     Status: None   Collection Time: 01/09/16  5:03 PM  Result Value Ref Range   Hgb A1c MFr Bld 5.2 4.0 - 6.0 %  Lipid panel, fasting     Status: Abnormal   Collection Time: 01/09/16  5:03 PM  Result Value Ref Range   Cholesterol 184 0 - 200 mg/dL   Triglycerides 956 <387 mg/dL   HDL 40 (L) >56 mg/dL   Total CHOL/HDL Ratio 4.6 RATIO    VLDL 27 0 - 40 mg/dL   LDL Cholesterol 433 (H) 0 - 99 mg/dL    Comment:        Total Cholesterol/HDL:CHD Risk Coronary Heart Disease Risk Table                     Men   Women  1/2 Average Risk   3.4   3.3  Average Risk       5.0   4.4  2 X Average Risk   9.6   7.1  3 X Average Risk  23.4   11.0        Use the calculated Patient Ratio above and the CHD Risk Table to determine the patient's CHD Risk.        ATP III CLASSIFICATION (LDL):  <100  mg/dL   Optimal  161-096  mg/dL   Near or Above                    Optimal  130-159  mg/dL   Borderline  045-409  mg/dL   High  >811     mg/dL   Very High   Prolactin     Status: None   Collection Time: 01/09/16  5:03 PM  Result Value Ref Range   Prolactin 4.4 4.0 - 15.2 ng/mL    Comment: (NOTE) Performed At: [facility] [address] [place], [place] [id number] Mila Homer MD [id number]   TSH     Status: None   Collection Time: 01/09/16  5:03 PM  Result Value Ref Range   TSH 1.689 0.350 - 4.500 uIU/mL    Blood Alcohol level:  Lab Results  Component Value Date   [facility] <5 01/08/2016   ETH <5 02/13/2015    Metabolic Disorder Labs:  Lab Results  Component Value Date   HGBA1C 5.2 01/09/2016   Lab Results  Component Value Date   PROLACTIN 4.4 01/09/2016   Lab Results  Component Value Date   CHOL 184 01/09/2016   TRIG 136 01/09/2016   HDL 40* 01/09/2016   CHOLHDL 4.6 01/09/2016   VLDL 27 01/09/2016   LDLCALC 117* 01/09/2016   LDLCALC 104* 02/18/2015    Current Medications: Current Facility-Administered Medications  Medication Dose Route Frequency Provider Last Rate Last Dose  . acetaminophen (TYLENOL) tablet 650 mg  650 mg Oral Q6H PRN Audery Amel, MD      . alum & mag hydroxide-simeth (MAALOX/MYLANTA) 200-200-20 MG/5ML suspension 30 mL  30 mL Oral Q4H PRN Audery Amel, MD      . ARIPiprazole (ABILIFY) tablet 20 mg  20 mg Oral Daily Audery Amel, MD   20 mg at 01/10/16 1018  . benztropine  (COGENTIN) tablet 1 mg  1 mg Oral Q6H PRN Audery Amel, MD      . magnesium hydroxide (MILK OF MAGNESIA) suspension 30 mL  30 mL Oral Daily PRN Audery Amel, MD      . nicotine (NICODERM CQ - dosed in mg/24 hours) patch 14 mg  14 mg Transdermal Daily Jimmy Footman, MD       PTA Medications: No prescriptions prior to admission    Musculoskeletal: Strength & Muscle Tone: within normal limits Gait & Station: normal Patient leans: N/A  Psychiatric Specialty Exam: Physical Exam  Constitutional: He is oriented to person, place, and time. He appears well-developed and well-nourished.  HENT:  Head: Normocephalic and atraumatic.  Eyes: EOM are normal.  Neck: Normal range of motion.  Respiratory: Effort normal.  Musculoskeletal: Normal range of motion.  Neurological: He is alert and oriented to person, place, and time.    Review of Systems  Constitutional: Negative.   HENT: Negative.   Eyes: Negative.   Respiratory: Negative.   Cardiovascular: Negative.   Gastrointestinal: Negative.   Genitourinary: Negative.   Musculoskeletal: Negative.   Skin: Negative.   Neurological: Negative.   Endo/Heme/Allergies: Negative.   Psychiatric/Behavioral: Negative.     Blood pressure 148/78, pulse 80, temperature 98.4 F (36.9 C), temperature source Oral, resp. rate 20, height  (1.626 m), weight 55.339 kg (122 lb), SpO2 99 %.Body mass index is 20.93 kg/(m^2).  General Appearance: Fairly Groomed  Patent attorney::  Good  Speech:  Clear and Coherent  Volume:  Normal  Mood:  Euthymic  Affect:  Appropriate  Thought Process:  Linear  Orientation:  Full (Time, Place, and Person)  Thought Content:  Hallucinations: Auditory  Suicidal Thoughts:  No  Homicidal Thoughts:  No  Memory:  Immediate;   Fair Recent;   Fair Remote;   Fair  Judgement:  Poor  Insight:  Shallow  Psychomotor Activity:  Normal  Concentration:  Fair  Recall:  [employer] of Knowledge:Fair  Language: Fair   Akathisia:  No  Handed:    AIMS (if indicated):     Assets:  Communication Skills  ADL's:  Intact  Cognition: WNL  Sleep:  Number of Hours: 8   Treatment Plan Summary:  Schizophrenia patient will be started on Abilify 20 mg by mouth daily. Prior to discharge the patient will receive Abilify injectable 400 mg IM (will order inj on 01/10/16)  Insomnia I will start the patient on trazodone 100 mg by mouth by mouth daily at bedtime when necessary  Tobacco use disorder the patient will be started on a nicotine patch 14 mg a day  Cannabis and cocaine use disorder mild: Issues with substance abuse should be addressed upon discharge. We plan to refer the patient to [facility] where he can receive psychiatric and substance abuse care.  Laboratory results: Prolactin level within the normal limits, hemoglobin A1c within the normal limits, lipid panel within the normal limits. TSH within the normal limits.  Imaging: Per records patient had a head CT in 2015 EXAM:  CT HEAD WITHOUT CONTRAST   TECHNIQUE:  Contiguous axial images were obtained from the base of the skull  through the vertex without contrast.   COMPARISON: 01/15/2013.   FINDINGS:  Intraosseous triangular shaped left temporoparietal metallic  structure represents a bone anchored hearing aid (BAHA) device. This  appears uncomplicated. No evidence for surrounding osteomyelitis or  loosening of the device.   No evidence for acute infarction, hemorrhage, mass lesion,  hydrocephalus, or extra-axial fluid. There is no atrophy or white  matter disease. Clear sinuses.   IMPRESSION:  Stable exam. No acute intracranial abnormality. BAHA device.    Precautions every 15 minute checks  Hospitalization status patient will sign in as voluntary today  Diet regular  Discharge disposition patient will return to his home in [place] where he lives with his brother  Discharge follow-up: Patient reports  having Medicaid and receives disability.  Patient will need to be connected with [facility] were he should be referred for community support team.    I certify that inpatient services furnished can reasonably be expected to improve the patient's condition.    Jimmy Footman, MD 4/7/201710:21 AM

## 2016-01-10 NOTE — Plan of Care (Signed)
Problem: Ineffective individual coping Goal: STG: Patient will remain free from self harm Outcome: Progressing Pt has remained free from self harm     

## 2016-01-10 NOTE — [facility] Suicide Risk Assessment (Signed)
[facility]   Nursing information obtained from:    Demographic factors:    Current Mental Status:    Loss Factors:    Historical Factors:    Risk Reduction Factors:     Total Time spent with patient: 1 hour Principal Problem: Undifferentiated schizophrenia (HCC) Diagnosis:   Patient Active Problem List   Diagnosis Date Noted  . Cocaine use disorder, mild, abuse [F14.10] 01/10/2016  . Cannabis use disorder, moderate, dependence (HCC) [F12.20] 01/10/2016  . Undifferentiated schizophrenia (HCC) [F20.3]   . Tobacco use disorder severe. [F17.200] 02/15/2015  . Cannabis use disorder severe. [F12.10] 02/15/2015   Subjective Data:   Continued Clinical Symptoms:  Alcohol Use Disorder Identification Test Final Score (AUDIT): 1 The "Alcohol Use Disorders Identification Test", Guidelines for Use in Primary Care, Second Edition.  World Science writerHealth Organization [facility](WHO). Score between 0-7:  no or low risk or alcohol related problems. Score between 8-15:  moderate risk of alcohol related problems. Score between 16-19:  high risk of alcohol related problems. Score 20 or above:  warrants further diagnostic evaluation for alcohol dependence and treatment.   CLINICAL FACTORS:   Alcohol/Substance Abuse/Dependencies Schizophrenia:   Command hallucinatons Previous Psychiatric Diagnoses and Treatments     Psychiatric Specialty Exam: ROS   COGNITIVE FEATURES THAT CONTRIBUTE TO RISK:  Closed-mindedness    SUICIDE RISK:   Moderate:  Frequent suicidal ideation with limited intensity, and duration, some specificity in terms of plans, no associated intent, good self-control, limited dysphoria/symptomatology, some risk factors present, and identifiable protective factors, including available and accessible social support.  [facility]  I certify that inpatient services furnished can reasonably be expected to improve the patient's condition.    Jimmy FootmanHernandez-Gonzalez,  Georgian Mcclory, MD 01/10/2016, 1:31 PM

## 2016-01-10 NOTE — B[facility] Counselor (Signed)
Adult Comprehensive Assessment  Patient ID: Justin Schaefer, male DOB: [birth year], 25 y.o. MRN: [id number]  Information Source: Information source: Patient  Current Stressors:  Family Relationships: good Surveyor, quantityinancial / Lack of resources (include bankruptcy): SSDI   Living/Environment/Situation:  Living Arrangements: Parent Living conditions (as described by patient or guardian): lives with dad and brother and sister lives with dads girlfreind How long has patient lived in current situation?: 5 years What is atmosphere in current home: Comfortable, Supportive  Family History:  Marital status: Single Does patient have children?: No  Childhood History:  By whom was/is the patient raised?: Mother Additional [place] information: [place] Description of patient's relationship with caregiver when they were a child: good relationships with parents Patient's description of current relationship with people who raised him/her: good dont talk to Mom now for a while Does patient have siblings?: Yes Number of Siblings: 3 Description of patient's current relationship with siblings: good relationship Did patient suffer any verbal/emotional/physical/sexual abuse as a child?: No Did patient suffer from severe childhood neglect?: No Has patient ever been sexually abused/assaulted/raped as an adolescent or adult?: No Was the patient ever a victim of a crime or a disaster?: No Witnessed domestic violence?: No Has patient been effected by domestic violence as an adult?: No  Education:  Highest grade of school patient has completed: GED Currently a Consulting civil engineerstudent?: No Learning disability?: No  Employment/Work Situation:  Employment situation: Disability- SSDI for 1 year  Patient's job has been impacted by current illness: No 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  Where was the patient employed at that  time?: 1 year  Has patient ever been in the [employer] and Companymilitary?: No Has patient ever served in Buyer, retailcombat?: No  Financial Resources:  Surveyor, quantityinancial resources: SSDI, Medicaid  Does patient have a Lawyerrepresentative payee or guardian?: No  Alcohol/Substance Abuse:  What has been your use of drugs/alcohol within the last 12 months?: smokes a little weed 1x month ( no problems identified) If attempted suicide, did drugs/alcohol play a role in this?: No Alcohol/Substance Abuse Treatment Hx: Denies past history Has alcohol/substance abuse ever caused legal problems?: No  Social Support System:  Conservation officer, natureatient's Community Support System: Good Describe Community Support System: father Type of faith/religion: non denominational How does patient's faith help to cope with current illness?: na  Leisure/Recreation:  Leisure and Hobbies: watch TV, documentaries,family  Strengths/Needs:  What things does the patient do well?: Im good at cleaning, and helping others In what areas does patient struggle / problems for patient: bordom, not motivated to clean, some challenges at home and finding work- Mental illness stops me from doing some things at times  Discharge Plan:  Does patient have access to transportation?: Yes Will patient be returning to same living situation after discharge?: Yes Currently receiving community mental health services: Yes (From Whom) ([facility]) If no, would patient like referral for services when discharged?: No Does patient have financial barriers related to discharge medications?: No (Attached to medication managment)  Summary/Recommendations:   Patient is a 25 year old male admitted  with a diagnosis of Schizophrenia. Patient presented to the hospital with SI and depression. Patient was unable to identify stressors. Prior to admission, pt bought a gun with the intent to kill himself. When the police arrived, he pointed it at them with the hopes they would shoot him. He reports he lives  with his brother and receives SSDI. He  reports he would like to find a job once discharged. He states he does not want to follow up with outpatient. Pt reports he can return home.  Patient will benefit from crisis stabilization, medication evaluation, group therapy and psycho education in addition to case management for discharge. At discharge, it is recommended that patient remain compliant with established discharge plan and continued treatment.   Justin Schaefer MSW, [place]  01/11/2016

## 2016-01-10 NOTE — Tx Team (Addendum)
Interdisciplinary Treatment Plan Update (Adult)        Date: 01/10/2016   Time Reviewed: 9:30 AM   Progress in Treatment: Improving  Attending groups: Continuing to assess, patient new to milieu  Participating in groups: Continuing to assess, patient new to milieu  Taking medication as prescribed: Yes  Tolerating medication: Yes  Family/Significant other contact made: No, CSW assessing for appropriate contacts  Patient understands diagnosis: Yes  Discussing patient identified problems/goals with staff: Yes  Medical problems stabilized or resolved: Yes  Denies suicidal/homicidal ideation: Yes  Issues/concerns per patient self-inventory: Yes  Other:   New problem(s) identified: N/A   Discharge Plan or Barriers: CSW continuing to assess, patient new to milieu.   Reason for Continuation of Hospitalization:   Depression   Anxiety   Medication Stabilization   Comments: N/A   Estimated length of stay: 3-5 days    Patient is a 25 year old male admitted under IVC for firing a handgun. Patient lives in a 25 y.o. male with a history of schizophrenia who presented to the ER after he purchased a gun with intent of committing suicide; police were called by neighbors when patient fired his weapon 3 times. Per their IVC papers, when they arrived he raised his weapon at them and told them he did so in hopes that they would shoot him and kill him. He stated he had these suicidal thoughts for "a while".  Urine toxicology screen was positive for cocaine, cannabis. Alcohol level was below the detection limit. Two prior hospitalizations in our unit for psychosis. His last discharge was in the summer of 2006. Then he was treated with Abilify 10 mg oral and receive Abilify injectable. Patient reported that he has not taking any medications or follow-up with psychiatry since then.  Patient reported to the ER psychiatrist that he fired the gun a couple times to be sure that he knew how it worked. He  pointed at himself a couple times but couldn't actually bring himself to shoot himself. By that time police showed up because neighbors had heard the gunshots. Michela Pitcher he wanted to kill himself because people were bothering him. His description of "people bothering him" was that he hears voices talking to him sometimes he can understand them and sometimes he can't. His thought process was described as disorganized while in the emergency department.  Today the patient tells me he is not depressed or suicidal. He tells me he is still hearing voices that tell him random things but he was not able to give me an example. He said the voices sometimes tell him good things. Patient tells me he smokes marijuana about once a week. He says that prior to admission he tried cocaine for the first time.   He denies having any current stressors. He denies problems in the relationship with his family. The patient has been receiving disability for about a year. 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  Patient says he is originally from [place]. He moved to [place] in his senior year of high school. He was living with his father and his father's girlfriend about about a year ago. For the last year he has been living with his younger brother who is 90 years old in [place].  Substance abuse history: Has had a past history of using cannabis but I believe this is the first time he's been identified  as possibly also using cocaine. Patient minimizes it even when I ask him several times he denies that he's been using any drugs.  Gun has been removed by police. Patient will benefit from crisis stabilization, medication evaluation, group therapy, and psycho education in addition to case management for discharge planning. Patient and CSW reviewed pt's identified goals and treatment plan. Pt verbalized understanding and agreed to treatment plan.      Review of initial/current patient goals per problem list:  1. Goal(s): Patient will participate in aftercare plan   Met: No  Target date: 3-5 days post admission date   As evidenced by: Patient will participate within aftercare plan AEB aftercare provider and housing plan at discharge being identified.   4/7: No, CSW assessing for appropriate contacts     2. Goal (s): Patient will exhibit decreased depressive symptoms and suicidal ideations.   Met: No  Target date: 3-5 days post admission date   As evidenced by: Patient will utilize self-rating of depression at 3 or below and demonstrate decreased signs of depression or be deemed stable for discharge by MD.   4/7: Goal progressing.    3. Goal(s): Patient will demonstrate decreased signs and symptoms of anxiety.   Met: No  Target date: 3-5 days post admission date   As evidenced by: Patient will utilize self-rating of anxiety at 3 or below and demonstrated decreased signs of anxiety, or be deemed stable for discharge by MD   4/7: Goal progressing.    4. Goal(s): Patient will demonstrate decreased signs of withdrawal due to substance abuse   Met: Yes  Target date: 3-5 days post admission date   As evidenced by: Patient will produce a CIWA/COWS score of 0, have stable vitals signs, and no symptoms of withdrawal   4/7: Patient produced a CIWA/COWS score of 0, has stable vitals signs, and no symptoms of withdrawal     5. Goal(s): Patient will demonstrate decreased signs of psychosis  * Met: No * Target date: 3-5 days post admission date  * As evidenced by: Patient will demonstrate decreased frequency of AVH or return to baseline function  4/7: Goal progressing.          Attendees: Physician: Merlyn Albert, MD 4/7/20174:33 PM  Nursing: Nicanor Bake, RN 4/7/20174:33 PM  Clinical Social Worker: Marylou Flesher, [place] 4/7/20174:33 PM  Psychologist: Consuella Lose 4/7/20174:33 PM   Nursing: 4/7/20174:33 PM  Rec Therapist: Everitt Amber, LRT 4/7/20174:33 PM  Patient: Justin Schaefer 4/7/20174:33 PM  Other:  4/7/20174:33 PM  Other:  4/7/20174:33 PM  Other:  4/7/20174:33 PM  Other:  4/7/20174:33 PM  Other:  4/7/20174:33 PM         Alphonse Guild. Yvana Samonte, LCSW, LCAS

## 2016-01-10 NOTE — Plan of Care (Signed)
Problem: Diagnosis: Increased Risk For Suicide Attempt Goal: LTG-Patient Will Report Improved Mood and Deny Suicidal LTG (by discharge) Patient will report improved mood and deny suicidal ideation.  Outcome: Progressing Denies suicidal ideations.     

## 2016-01-10 NOTE — Progress Notes (Signed)
D: Observed pt in room resting on bed . Patient alert and oriented x4. Patient denies SI/HI. Pt endorsed AH "Don't know what they say." Pt denies visual hallucinations, but talked about seeing "faces when I close my eye."  Pt affect is anxious. Pt responses to questions were delayed and minimal. Pt denied feeling depressed or anxious, but did appear anxious during interaction. When asked about mood pt stated "I'm all right...feel regular." Pt had no complaints.  A: Offered active listening and support. Provided therapeutic communication. Encouraged pt to attend wrap up group, along with future groups tomorrow. Encouraged pt to socialize on the unit.  R: Pt pleasant and cooperative. Pt attended wrap-up group. Pt had no medications this shift. Will continue Q15 min. checks. Safety maintained.

## 2016-01-11 NOTE — Progress Notes (Addendum)
D: Pt pleasant and visible in milieu but had minimal interaction with staff and peers. Affect depressed. Forwards little. Denies SI/AVH at this time.  Denies pain. Voices no concerns. A: Encouragement and support provided. Q15 minute checks maintained for safety. R: Pt pleasant but guarded. Will continue to monitor.

## 2016-01-11 NOTE — Progress Notes (Signed)
Pt  Has been pleasant and cooperative. Pt has been seclusive to his room. No inappropriate behaviors noted. 

## 2016-01-11 NOTE — [facility] Group Notes (Signed)
[facility] LCSW Group Therapy  01/11/2016 1:23 PM  Type of Therapy:  Group Therapy  Participation Level:  Minimal  Participation Quality:  Attentive  Affect:  Flat  Cognitive:  Alert  Insight:  Limited  Engagement in Therapy:  Limited  Modes of Intervention:  Discussion, Education, Socialization and Support  Summary of Progress/Problems: Self esteem: Patients discussed self esteem and how it impacts them. They discussed what aspects in their lives has influenced their self esteem. They were challenged to identify changes that are needed in order to improve self esteem.  Justin Schaefer attended group and stayed the entire time. He sat quietly and listened to other group members share.   Sempra EnergyCandace L Parrish Bonn MSW, LCSWA  01/11/2016, 1:23 PM

## 2016-01-11 NOTE — [facility] Group Notes (Signed)
[facility] Group Notes:  (Nursing/MHT/Case Management/Adjunct)  Date:  01/11/2016  Time:  10:44 AM  Type of Therapy:  goal setting   Participation Level:  Active  Participation Quality:  Appropriate  Affect:  Appropriate  Cognitive:  Appropriate  Insight:  Appropriate  Engagement in Group:  Supportive  Modes of Intervention:  goal setting   Summary of Progress/Problems:  Justin Schaefer C [name] 01/11/2016, 10:44 AM

## 2016-01-12 NOTE — [facility] Group Notes (Signed)
[facility] LCSW Group Therapy  01/12/2016 4:34 PM   Type of Therapy:  Group Therapy  Participation Level:  Minimal  Participation Quality:  Attentive  Affect:  Flat  Cognitive:  Alert  Insight:  Improving  Engagement in Therapy:  Improving  Modes of Intervention:  Discussion, Education, Socialization and Support  Summary of Progress/Problems: Todays topic: Grudges  Patients will be encouraged to discuss their thoughts, feelings, and behaviors as to why one holds on to grudges and reasons why people have grudges. Patients will process the impact of grudges on their daily lives and identify thoughts and feelings related to holding grudges. Patients will identify feelings and thoughts related to what life would look like without grudges. Pt attended group and stayed the entire time. Pt sat quietly and listened to other group members share.   Tracye Szuch L Tashonda Pinkus MSW, LCSWA  01/12/2016, 4:34 PM  

## 2016-01-12 NOTE — Plan of Care (Signed)
Problem: Ineffective individual coping Goal: LTG: Patient will report a decrease in negative feelings Outcome: Progressing Patient reports feeling better today than he did yesterday     

## 2016-01-12 NOTE — Plan of Care (Signed)
Problem: Alteration in thought process Goal: LTG-Patient has not harmed self or others in at least 2 days Outcome: Progressing He has remained free from harm since admission.

## 2016-01-12 NOTE — Plan of Care (Signed)
Problem: Ineffective individual coping Goal: STG-Increase in ability to manage activities of daily living Outcome: Progressing Patient is able to manage his activities of daily living currently     

## 2016-01-12 NOTE — Progress Notes (Signed)
D: Patient appears anxious. He has been isolative towards his room and does not interact with peers. He denies SI/HI/AVH currently. He denies any pain.  A: No medication was given on this shift. Encouragement was provided.  R: He has remained calm and cooperative. Safety maintained with 15 min checks.

## 2016-01-12 NOTE — Progress Notes (Signed)
[facility]  01/12/2016 6:34 AM Michael BostonCarlos E Kapfer  MRN:  [id number]  Subjective:  [name] feels much better today. He is out of his room in the community interacting with patients. His mood is improving, affect is bright, he is smiling quite a bit. He has been accepting medications and seems to tolerate it well. He responded well to Abilify in the past. He received his Abilify Maintena injection already and now says that he will continue in the community. He is no longer suicidal. There are no somatic complaints. Sleep and appetite are good. He requested bigger portions today  Principal Problem: Undifferentiated schizophrenia (HCC) Diagnosis:   Patient Active Problem List   Diagnosis Date Noted  . Tobacco use disorder severe. [F17.200] 02/15/2015    Priority: Medium  . Cocaine use disorder, mild, abuse [F14.10] 01/10/2016  . Cannabis use disorder, moderate, dependence (HCC) [F12.20] 01/10/2016  . Undifferentiated schizophrenia (HCC) [F20.3]    Total Time spent with patient: 20 minutes  Past Psychiatric History: Schizophrenia.  Past Medical History:  Past Medical History  Diagnosis Date  . 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 Schizophrenia [facility](HCC)     Past Surgical History  Procedure Laterality Date  . Cochlear implant     Family History: History reviewed. No pertinent family history. Family Psychiatric  History: See H&P. Social History:  History  Alcohol Use No     History  Drug Use  . Yes  . Special: Marijuana    Social History   Social History  . Marital Status: Single    Spouse Name: N/A  . Number of Children: N/A  . Years of Education: N/A   Social History Main Topics  . Smoking status: Current Every Day Smoker -- 0.25 packs/day    Types: Cigarettes  . Smokeless tobacco: None  . Alcohol Use: No  . Drug Use: Yes    Special: Marijuana  . Sexual Activity: No   Other Topics Concern  . None   Social History Narrative   Additional Social  History:    History of alcohol / drug use?: Yes                    Sleep: Fair  Appetite:  Good  Current Medications: Current Facility-Administered Medications  Medication Dose Route Frequency Provider Last Rate Last Dose  . acetaminophen (TYLENOL) tablet 650 mg  650 mg Oral Q6H PRN Audery AmelJohn T Clapacs, MD      . alum & mag hydroxide-simeth (MAALOX/MYLANTA) 200-200-20 MG/5ML suspension 30 mL  30 mL Oral Q4H PRN Audery AmelJohn T Clapacs, MD      . ARIPiprazole (ABILIFY) tablet 20 mg  20 mg Oral Daily Audery AmelJohn T Clapacs, MD   20 mg at 01/11/16 0857  . ARIPiprazole SUSR 400 mg  400 mg Intramuscular Q30 days Jimmy FootmanAndrea Hernandez-Gonzalez, MD   400 mg at 01/10/16 1726  . magnesium hydroxide (MILK OF MAGNESIA) suspension 30 mL  30 mL Oral Daily PRN Audery AmelJohn T Clapacs, MD      . nicotine (NICODERM CQ - dosed in mg/24 hours) patch 14 mg  14 mg Transdermal Daily Jimmy FootmanAndrea Hernandez-Gonzalez, MD   14 mg at 01/11/16 0858  . traZODone (DESYREL) tablet 100 mg  100 mg Oral QHS PRN Jimmy FootmanAndrea Hernandez-Gonzalez, MD        Lab Results: No results found for this or any previous visit (from the past 48 hour(s)).  Blood Alcohol level:  Lab Results  Component Value Date  ETH <5 01/08/2016   ETH <5 02/13/2015    Physical Findings: AIMS:  , ,  ,  , Dental Status Current problems with teeth and/or dentures?: No Does patient usually wear dentures?: No  CIWA:    COWS:     Musculoskeletal: Strength & Muscle Tone: within normal limits Gait & Station: normal Patient leans: N/A  Psychiatric Specialty Exam: Review of Systems  Psychiatric/Behavioral: Positive for depression and hallucinations.  All other systems reviewed and are negative.   Blood pressure 123/76, pulse 76, temperature 98 F (36.7 C), temperature source Oral, resp. rate 20, height  (1.626 m), weight 55.339 kg (122 lb), SpO2 99 %.Body mass index is 20.93 kg/(m^2).  General Appearance: Casual  Eye Contact::  Good  Speech:  Clear and Coherent  Volume:   Normal  Mood:  Euthymic  Affect:  Appropriate  Thought Process:  Goal Directed  Orientation:  Full (Time, Place, and Person)  Thought Content:  WDL  Suicidal Thoughts:  No  Homicidal Thoughts:  No  Memory:  Immediate;   Fair Recent;   Fair Remote;   Fair  Judgement:  Impaired  Insight:  Shallow  Psychomotor Activity:  Normal  Concentration:  Fair  Recall:  [employer] of Knowledge:Fair  Language: Fair  Akathisia:  No  Handed:  Right  AIMS (if indicated):     Assets:  Communication Skills Desire for Improvement Financial Resources/Insurance Housing Physical Health Resilience Social Support  ADL's:  Intact  Cognition: WNL  Sleep:  Number of Hours: 7.5   Treatment Plan Summary: Daily contact with patient to assess and evaluate symptoms and progress in treatment and Medication management   Mr. Belton is a 25 year old male with a history of schizophrenia admitted for worsening of depression and suicide attempt by a gun and provoking suicide by police.   1. Suicidal ideation. The patient is able to contract for safety.  2. Mood and psychosis. He accepted oral Abilify and Abilify Maintena injection for psychosis .   3. Insomnia. He is on trazodone.   4. Smoking. He is on nicotine patch.   5. Substance abuse. The patient minimizes his problems and is not interested in substance abuse treatment at this point.   6. Precautions every 15 minute checks.  7. Hospitalization status patient will sign in as voluntary today.I wonder if this is a good decision since the patient was able to purchase a gun. If committed properly he will be prohibited from buying a gun legally.   8. Diet regular  9. Discharge disposition patient will return to his home in [place] where he lives with his brother. He may be moving in with his father.   10. Discharge follow-up: Patient reports having Medicaid and receives disability. Patient will need to be connected with [facility] were he  should be referred for community support team.   We ordered double portions 01/12/16.   Kristine Linea, MD 01/12/2016, 6:34 AM

## 2016-01-13 MED ORDER — BENZTROPINE MESYLATE 1 MG PO TABS
0.5000 mg | ORAL_TABLET | Freq: Two times a day (BID) | ORAL | Status: DC
  Administered 2016-01-13 – 2016-01-14 (×2): 0.5 mg via ORAL
  Filled 2016-01-13 (×2): qty 1

## 2016-01-13 NOTE — Progress Notes (Signed)
D: Patient appears anxious. He has been isolative towards his room and does not interact with peers. He denies SI/HI/AVH currently. He denies any pain. Patient requested PRN trazodone for sleep. A: Medication was given with education. Encouragement was provided.  R: He has been compliant with medication. He has remained calm and cooperative. Safety maintained with 15 min checks.

## 2016-01-13 NOTE — Progress Notes (Signed)
D: Patient presents calm and cooperative. Visible in mileu and does not interact with peers. He denies SI/HI/AVH currently. He denies any pain.  A:. Encouragement and support provided.  R: Med and group compliant. Safety maintained with 15 min checks.

## 2016-01-13 NOTE — Progress Notes (Addendum)
[facility] MD Progress Note  01/13/2016 9:44 AM Justin Schaefer  MRN:  [id number]  Subjective:  Justin Schaefer feels much better today. He is out of his room in the community interacting with patients. He continues to pace around the unit and appears restless. Pt shared he has been restless since he started to hear voices about 2 years ago. Pt shared he does not hear voices at this time and at baseline, the voices stop. He reviewed his reasons for admission and noted he now understands that people care about him.  He is eating and sleeping well. He denies SI, HI and AVH.   Pt shared he and his brother want to follow-up with [facility] for continued patient care. He also notes his brother is permitting him to return home.   Nursing notes reviewed. Pt appears anxious. Isolative to his room and does not interact with peers. Denies SI, HI and AVH.  PRN trazodone given for sleep.  Medication compliant.   Principal Problem: Undifferentiated schizophrenia (HCC) Diagnosis:   Patient Active Problem List   Diagnosis Date Noted  . Cocaine use disorder, mild, abuse [F14.10] 01/10/2016  . Cannabis use disorder, moderate, dependence (HCC) [F12.20] 01/10/2016  . Undifferentiated schizophrenia (HCC) [F20.3]   . Tobacco use disorder severe. [F17.200] 02/15/2015   Total Time spent with patient: 20 minutes  Past Psychiatric History: Schizophrenia.  Past Medical History:  Past Medical History  Diagnosis Date  . 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 Schizophrenia [facility])     Past Surgical History  Procedure Laterality Date  . Cochlear implant     Family History: History reviewed. No pertinent family history. Family Psychiatric  History: See H&P. Social History:  History  Alcohol Use No     History  Drug Use  . Yes  . Special: Marijuana    Social History   Social History  . Marital Status: Single    Spouse Name: N/A  . Number of Children: N/A  . Years of Education: N/A   Social History Main Topics  .  Smoking status: Current Every Day Smoker -- 0.25 packs/day    Types: Cigarettes  . Smokeless tobacco: None  . Alcohol Use: No  . Drug Use: Yes    Special: Marijuana  . Sexual Activity: No   Other Topics Concern  . None   Social History Narrative   Additional Social History:    History of alcohol / drug use?: Yes  Sleep: Fair  Appetite:  Good  Current Medications: Current Facility-Administered Medications  Medication Dose Route Frequency Provider Last Rate Last Dose  . acetaminophen (TYLENOL) tablet 650 mg  650 mg Oral Q6H PRN Audery Amel, MD      . alum & mag hydroxide-simeth (MAALOX/MYLANTA) 200-200-20 MG/5ML suspension 30 mL  30 mL Oral Q4H PRN Audery Amel, MD      . ARIPiprazole (ABILIFY) tablet 20 mg  20 mg Oral Daily Audery Amel, MD   20 mg at 01/13/16 0901  . ARIPiprazole SUSR 400 mg  400 mg Intramuscular Q30 days Jimmy Footman, MD   400 mg at 01/10/16 1726  . magnesium hydroxide (MILK OF MAGNESIA) suspension 30 mL  30 mL Oral Daily PRN Audery Amel, MD      . nicotine (NICODERM CQ - dosed in mg/24 hours) patch 14 mg  14 mg Transdermal Daily Jimmy Footman, MD   14 mg at 01/13/16 0901  . traZODone (DESYREL) tablet 100 mg  100 mg Oral QHS  PRN Jimmy FootmanAndrea Hernandez-Gonzalez, MD   100 mg at 01/12/16 2148    Lab Results: No results found for this or any previous visit (from the past 48 hour(s)).  Blood Alcohol level:  Lab Results  Component Value Date   ETH <5 01/08/2016   ETH <5 02/13/2015    Physical Findings: AIMS: 0  Dental Status Current problems with teeth and/or dentures?: No Does patient usually wear dentures?: No  CIWA:    COWS:     Musculoskeletal: Strength & Muscle Tone: within normal limits Gait & Station: normal Patient leans: N/A  Psychiatric Specialty Exam: Review of Systems  Psychiatric/Behavioral: Positive for depression and hallucinations.  All other systems reviewed and are negative.   Blood pressure 128/86,  pulse 85, temperature 97.9 F (36.6 C), temperature source Oral, resp. rate 20, height 5\' 4"  (1.626 m), weight 55.339 kg (122 lb), SpO2 99 %.Body mass index is 20.93 kg/(m^2).  General Appearance: Casual  Eye Contact::  Good  Speech:  Clear and Coherent  Volume:  Normal  Mood:  Euthymic  Affect:  Appropriate  Thought Process:  Coherent, Goal Directed, Intact, Linear and Logical  Orientation:  Full (Time, Place, and Person)  Thought Content:  WDL  Suicidal Thoughts:  No  Homicidal Thoughts:  No  Memory:  Immediate;   Fair Recent;   Fair Remote;   Fair  Judgement:  Poor  Insight:  Shallow  Psychomotor Activity:  Normal  Concentration:  Fair  Recall:  [employer]  Fund of Knowledge:Fair  Language: Fair  Akathisia:  No  Handed:  Right  AIMS (if indicated):     Assets:  Communication Skills Desire for Improvement Financial Resources/Insurance Housing Physical Health Resilience Social Support  ADL's:  Intact  Cognition: WNL  Sleep:  Number of Hours: 6.75   Treatment Plan Summary: Daily contact with patient to assess and evaluate symptoms and progress in treatment and Medication management   Justin Schaefer is a 25 year old male with a history of schizophrenia admitted for worsening of depression and suicide attempt by a gun and provoking suicide by police.   1. Suicidal ideation. The patient is able to contract for safety.  2. Mood and psychosis.   - He accepted oral Abilify and Abilify Maintena injection for psychosis.  - Start benztropine 0.5mg  po BID for akathisia.    3. Insomnia. He is on trazodone.   4. Smoking. He is on nicotine patch.   5. Substance abuse. The patient minimizes his problems and is not interested in substance abuse treatment at this point.   6. Precautions every 15 minute checks.  7. Hospitalization status patient will sign in as voluntary today.I wonder if this is a good decision since the patient was able to purchase a gun. If committed properly he  will be prohibited from buying a gun legally.   8. Diet regular  9. Discharge disposition patient will return to [place] where he lives with his brother. He may be moving in with his father.   10. Discharge follow-up: Patient reports having Medicaid and receives disability. Patient will need to be connected with [facility] were he should be referred for community support team.   We ordered double portions 01/12/16.   Gena FrayMcQueen, Argyle Gustafson G, MD 01/13/2016, 9:44 AM

## 2016-01-13 NOTE — [facility] Group Notes (Signed)
[facility] Group Notes:  (Nursing/MHT/Case Management/Adjunct)  Date:  01/13/2016  Time:  10:41 PM  Type of Therapy:  Evening Wrap-up Group  Participation Level:  Minimal  Participation Quality:  Appropriate  Affect:  Appropriate  Cognitive:  Alert and Appropriate  Insight:  Appropriate and Good  Engagement in Group:  Developing/Improving  Modes of Intervention:  Discussion  Summary of Progress/Problems:  Tomasita MorrowChelsea Nanta Sharone Almond 01/13/2016, 10:41 PM

## 2016-01-13 NOTE — [facility] Group Notes (Signed)
[facility] LCSW Group Therapy  01/13/2016 4:09 PM  Type of Therapy:  Group Therapy  Participation Level:  Active  Participation Quality:  Appropriate, Attentive, Sharing and Supportive  Affect:  Flat  Cognitive:  Appropriate  Insight:  Improving  Engagement in Therapy:  Engaged  Modes of Intervention:  Activity, Discussion, Problem-solving, Socialization and Support  Summary of Progress/Problems:Utilized Wheel of Fortune activity to introduce topic of the day.  Pt attended and participated in group discussion around overcoming obstacles, Pt able to share some obstacles they are facing and as a group examined the way our thoughts contribute to the way obstacles are viewed.  Practiced identifying unhelpful thoughts.  Justin Schaefer, Justin Schaefer, MSW, LCSW 01/13/2016, 4:09 PM

## 2016-01-13 NOTE — Plan of Care (Signed)
Problem: Alteration in thought process Goal: LTG-Patient has not harmed self or others in at least 2 days Outcome: Progressing Pt safe on the unit at this time      

## 2016-01-13 NOTE — Progress Notes (Signed)
Recreation Therapy Notes  Date: 04.10.17 Time: 9:45 am Location: Craft Room  Group Topic: Coping Skills  Goal Area(s) Addresses:  Patient will participate in healthy coping skill. Patient will verbalize one positive emotion experienced during group.  Behavioral Response: Arrived late, Attentive, Interactive,   Intervention: Coloring  Activity: Patients were given coloring sheets to color and were encouraged to think of what emotions they were experiencing while coloring and what they were thinking about.  Education: LRT educated patients on healthy coping skills.  Education Outcome: Acknowledges education/In group clarification offered   Clinical Observations/Feedback: Patient arrived to group at approximately 9:50 am. Patient completed activity by coloring the coloring sheet. Patient contributed to group discussion by stating what emotions he was experiencing during group.  Jacquelynn CreeGreene,Aleigha Gilani M, LRT/CTRS 01/13/2016 10:40 AM

## 2016-01-13 NOTE — Plan of Care (Signed)
Problem: Ineffective individual coping Goal: LTG: Patient will report a decrease in negative feelings Outcome: Progressing Pt reports feeling better

## 2016-01-13 NOTE — [facility] Group Notes (Signed)
[facility] Group Notes:  (Nursing/MHT/Case Management/Adjunct)  Date:  01/13/2016  Time:  3:55 AM  Type of Therapy:  Group Therapy  Participation Level:  Active  Participation Quality:  Appropriate  Affect:  Appropriate  Cognitive:  Appropriate  Insight:  Appropriate  Engagement in Group:  Engaged  Modes of Intervention:  n/a  Summary of Progress/Problems:  Justin Schaefer 01/13/2016, 3:55 AM

## 2016-01-13 NOTE — Progress Notes (Signed)
D: Pt denies SI/HI/AVH. Pt is pleasant and cooperative. Pt had minimal interaction on the unit this evening. Pt isolated to room most of the night.   A: Pt was offered support and encouragement. Pt was given scheduled medications. Pt was encourage to attend groups. Q 15 minute checks were done for safety.   R: Pt is taking medication. Pt has no complaints.Pt receptive to treatment and safety maintained on unit.

## 2016-01-14 MED ORDER — TRAZODONE HCL 50 MG PO TABS
75.0000 mg | ORAL_TABLET | Freq: Every evening | ORAL | Status: DC | PRN
  Administered 2016-01-14 – 2016-01-20 (×6): 75 mg via ORAL
  Filled 2016-01-14 (×8): qty 2

## 2016-01-14 NOTE — Progress Notes (Signed)
Patient denied SI/HI and was able to contract for safety. Patient did state that he is still hearing voices but could not tell exactly what the voices were saying. Encouraged to voice any feelings, increase in voices and other concerns to nursing staff. He attended groups but noted to be isolative.

## 2016-01-14 NOTE — Progress Notes (Signed)
[facility] MD Progress Note  01/14/2016 8:55 AM Justin Schaefer  MRN:  [id number]  Subjective:    Nursing notes reviewed. Pt attended groups.  Calm, cooperative. Isolated to room most of evening.   Justin Schaefer feels he is doing well. He speaks with this provider and answers my questions readily. He feels his conscience is telling him to "calm down." Pt denies any feelings of anger and aggression. He shared he has been remaining calm.  He does not feel his conscience is an auditory hallucination because it sounds like his own voice and is only one voice.  Pt feels he last heard AH yesterday which were negative in nature.  It is difficult to tell when asking and with explanation by this provider if the pt is experiencing thought insertion and thought broadcasting.  He denies feeing his body is under external control, thought withdrawal. Pt feels when talking to others, there may be someone posing as an imposter of the person he is speaking with. This happens at times. Pt is willing to increase Abilify if necessary.   He did share nightmares started about 2 years ago when he started to hear voices. He had 2 nightmares last night which he described as "very real." He noticed his dreams became more vivid with use of trazodone. He feels he is sleeping well but would like for his dreams to be less realistic. Pt is willing to decrease trazodone to  po QHS.   Pt is medication compliant. Pt does not want to take benztropine although it appears to have decreased pacing activities. Explained to pt the use of benztropine for akathisia. Also explained to pt I would stop this medication at his request but the option is there if he feels he akathisia is an issue. Pt denies medication side effects including TD, EPS and akathisia. Pt denies SI, HI and AH.  Principal Problem: Undifferentiated schizophrenia (HCC) Diagnosis:   Patient Active Problem List   Diagnosis Date Noted  . Cocaine use disorder, mild, abuse  [F14.10] 01/10/2016  . Cannabis use disorder, moderate, dependence (HCC) [F12.20] 01/10/2016  . Undifferentiated schizophrenia (HCC) [F20.3]   . Tobacco use disorder severe. [F17.200] 02/15/2015   Total Time spent with patient: 20 minutes  Past Psychiatric History: Schizophrenia.  Past Medical History:  Past Medical History  Diagnosis Date  . 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 Schizophrenia [facility])     Past Surgical History  Procedure Laterality Date  . Cochlear implant     Family History: History reviewed. No pertinent family history. Family Psychiatric  History: See H&P. Social History:  History  Alcohol Use No     History  Drug Use  . Yes  . Special: Marijuana    Social History   Social History  . Marital Status: Single    Spouse Name: N/A  . Number of Children: N/A  . Years of Education: N/A   Social History Main Topics  . Smoking status: Current Every Day Smoker -- 0.25 packs/day    Types: Cigarettes  . Smokeless tobacco: None  . Alcohol Use: No  . Drug Use: Yes    Special: Marijuana  . Sexual Activity: No   Other Topics Concern  . None   Social History Narrative   Additional Social History:    History of alcohol / drug use?: Yes  Sleep: Fair  Appetite:  Good  Current Medications: Current Facility-Administered Medications  Medication Dose Route Frequency Provider Last Rate Last Dose  . acetaminophen (  TYLENOL) tablet 650 mg  650 mg Oral Q6H PRN Audery AmelJohn T Clapacs, MD      . alum & mag hydroxide-simeth (MAALOX/MYLANTA) 200-200-20 MG/5ML suspension 30 mL  30 mL Oral Q4H PRN Audery AmelJohn T Clapacs, MD      . ARIPiprazole (ABILIFY) tablet 20 mg  20 mg Oral Daily Audery AmelJohn T Clapacs, MD   20 mg at 01/14/16 0845  . ARIPiprazole SUSR 400 mg  400 mg Intramuscular Q30 days Jimmy FootmanAndrea Hernandez-Gonzalez, MD   400 mg at 01/10/16 1726  . benztropine (COGENTIN) tablet 0.5 mg  0.5 mg Oral BID Gena Frayyan G Hillari Zumwalt, MD   0.5 mg at 01/14/16 0845  . magnesium hydroxide (MILK OF MAGNESIA)  suspension 30 mL  30 mL Oral Daily PRN Audery AmelJohn T Clapacs, MD      . nicotine (NICODERM CQ - dosed in mg/24 hours) patch 14 mg  14 mg Transdermal Daily Jimmy FootmanAndrea Hernandez-Gonzalez, MD   14 mg at 01/14/16 0848  . traZODone (DESYREL) tablet 100 mg  100 mg Oral QHS PRN Jimmy FootmanAndrea Hernandez-Gonzalez, MD   100 mg at 01/13/16 2208    Lab Results: No results found for this or any previous visit (from the past 48 hour(s)).  Blood Alcohol level:  Lab Results  Component Value Date   ETH <5 01/08/2016   ETH <5 02/13/2015    Physical Findings: AIMS: 0  Dental Status Current problems with teeth and/or dentures?: No Does patient usually wear dentures?: No  CIWA:    COWS:     Musculoskeletal: Strength & Muscle Tone: within normal limits Gait & Station: normal Patient leans: N/A  Psychiatric Specialty Exam: Review of Systems  Psychiatric/Behavioral: Positive for hallucinations. Negative for depression, suicidal ideas, memory loss and substance abuse. The patient is not nervous/anxious and does not have insomnia.   All other systems reviewed and are negative.   Blood pressure 122/89, pulse 81, temperature 98.2 F (36.8 C), temperature source Oral, resp. rate 18, height 5\' 4"  (1.626 m), weight 55.339 kg (122 lb), SpO2 99 %.Body mass index is 20.93 kg/(m^2).  General Appearance: Casual  Eye Contact::  Fair, pt stares out of the window at times but does not appear to respond to internal stimuli.   Speech:  Clear and Coherent  Volume:  Normal  Mood:  Euthymic  Affect:  Appropriate. Smiles and laughs appropriately   Thought Process:  Coherent, Goal Directed, Intact, Linear and Logical  Orientation:  Full (Time, Place, and Person)  Thought Content:  WDL  Suicidal Thoughts:  No  Homicidal Thoughts:  No  Memory:  Immediate;   Fair Recent;   Fair Remote;   Fair  Judgement:  Fair  Insight:  Fair  Psychomotor Activity:  Normal  Concentration:  Good  Recall:  [employer]Fair  Fund of Knowledge:Fair  Language:  Fair  Akathisia:  No  Handed:  Right  AIMS (if indicated):     Assets:  Communication Skills Desire for Improvement Financial Resources/Insurance Housing Physical Health Resilience Social Support  ADL's:  Intact  Cognition: WNL  Sleep:  Number of Hours: 6.3   Treatment Plan Summary: Daily contact with patient to assess and evaluate symptoms and progress in treatment and Medication management   Justin Schaefer is a 25 year old male with a history of schizophrenia admitted for worsening of depression and suicide attempt by a gun and provoking suicide by police. Pt is improving.   1. Suicidal ideation. The patient is able to contract for safety.  2. Mood and psychosis.   -  He accepted oral Abilify and Abilify Maintena injection for psychosis.  - Discontinue benztropine 0.5mg  po BID for akathisia.    3. Insomnia: improved  - Will decrease trazodone to  po QHS due to vivid dreams.   4.Smoking. He is on the nicotine patch.   5. Substance abuse. The patient minimizes his problems and is not interested in substance abuse treatment at this point.   6. Precautions every 15 minute checks.  7. Hospitalization status: voluntary   8. Diet regular  9. Discharge disposition patient will return to his home in [place], [place] where he lives with his brother. He may be moving in with his father.   10. Discharge follow-up: Patient reports having Medicaid and receives disability. Patient will need to be connected with [facility] were he should be referred for community support team.   We ordered double portions 01/12/16.   Gena Fray, MD 01/14/2016, 8:55 AM

## 2016-01-14 NOTE — Plan of Care (Signed)
Problem: Ineffective individual coping Goal: STG: Patient will remain free from self harm Outcome: Progressing Patient denied SI, AH/VH and HI and contracted for safety.

## 2016-01-14 NOTE — [facility] Group Notes (Signed)
Adult Psychoeducational Group Note  Date:  01/14/2016 Time:  2:43 PM  Group Topic/Focus:  Recovery Goals:   The focus of this group is to identify appropriate goals for recovery and establish a plan to achieve them.  Participation Level:  Minimal  Participation Quality:  Appropriate  Affect:  Appropriate  Cognitive:  Appropriate  Insight: Appropriate  Engagement in Group:  Developing/Improving  Modes of Intervention:  Education  Additional Comments:  Patient stated he will take his medication when he is discharged.  Patient stated he will attend his classes as well.    Estevan OaksWhitaker, Shakeela Rabadan Shaunte 01/14/2016, 2:43 PM

## 2016-01-15 MED ORDER — ARIPIPRAZOLE 10 MG PO TABS
5.0000 mg | ORAL_TABLET | ORAL | Status: AC
  Administered 2016-01-15: 5 mg via ORAL
  Filled 2016-01-15: qty 1

## 2016-01-15 MED ORDER — ARIPIPRAZOLE 15 MG PO TABS
25.0000 mg | ORAL_TABLET | Freq: Every day | ORAL | Status: DC
  Administered 2016-01-16: 25 mg via ORAL
  Filled 2016-01-15: qty 1

## 2016-01-15 NOTE — [facility] Group Notes (Signed)
[facility] Group Notes:  (Nursing/MHT/Case Management/Adjunct)  Date:  01/15/2016  Time:  3:55 PM  Type of Therapy:  Psychoeducational Skills  Participation Level:  Did Not Attend  Justin Schaefer 01/15/2016, 3:55 PM

## 2016-01-15 NOTE — Progress Notes (Signed)
D: Pleasant and cooperative.  Denies SI/HI and depression.  Verbalizes that he does hear voices but cannot make out what they are saying.  Per charge nurses, during staffing patient states that he has a rat in his head and that he sees an imposter and the imposter put the rat in his head. Stays to himself.  Minimal interaction noted with peers, no initiation of conversations on his part.   A:Support and encouragement offered.  Encouraged to attend groups and to participate.  Scheduled medications administered.  Safety maintained. R:  Receptive to treatment plan.  Safety maintained.

## 2016-01-15 NOTE — Progress Notes (Signed)
D: Patient is still flat but appears more talkative. He denies SI/HI/AVH. He's less isolative to his room and seen pacing the halls. No interaction with peers. He denies any pain. Patient requested PRN for sleep.  A: Medication given with education. Encouragement provided.  R: Patient compliant with medication. He has remained calm and cooperative. Safety maintained with 15 min checks.

## 2016-01-15 NOTE — [facility] Group Notes (Signed)
[facility] LCSW Group Therapy  01/15/2016 4:19 PM  Type of Therapy:  Group Therapy  Participation Level:  Active  Participation Quality:  Appropriate  Affect:  Flat, blunted  Cognitive:  Disorganized and Hallucinating  Insight:  Improving  Engagement in Therapy:  Engaged  Modes of Intervention:  Activity, Discussion, Problem-solving, Socialization and Support  Summary of Progress/Problems:Pt attended and participated in group discussion around emotion regulation/recovery/and review of the inside/outside bag activity that patients worked on in previous morning group.  Pt shared appropriately about his bag and described inside/outside activity in an appropriate way that could be understood.  Began to talk about strategies to make the inside and outside match and how this would bring joy and peace both can bring.  Glennon MacLaws, Tymesha Ditmore P, MSW, LCSW 01/15/2016, 4:19 PM

## 2016-01-15 NOTE — [facility] Group Notes (Signed)
[facility] LCSW Group Therapy  01/15/2016 4:17 PM  Type of Therapy:  Group Therapy  Participation Level:  Minimal  Participation Quality:  Appropriate  Affect:  Appropriate  Cognitive:  Disorganized and Hallucinating  Insight:  Improving  Engagement in Therapy:  Engaged  Modes of Intervention:  Activity, Discussion, Socialization and Support  Summary of Progress/Problems:Pt attended and participated in morning activity where group discussed incongruence of the way we allow ourselves to be perceived versus the real us on the inside.  Group completed Inside/outside activity decorating outside of paper bag with other's perceptions or what we allow to show and filled the inside with images and words that describe the true inside.  Group discussed the discomfort these incongruent parts can cause and strategies to begin to make them match.  Glennon MacLaws, Carleah Yablonski P, MSW, LCSW 01/15/2016, 4:17 PM

## 2016-01-15 NOTE — Tx Team (Signed)
Interdisciplinary Treatment Plan Update (Adult)        Date: 01/15/2016   Time Reviewed: 9:30 AM   Progress in Treatment: Improving  Attending groups: Yes  Participating in groups: Yes Taking medication as prescribed: Yes  Tolerating medication: Yes  Family/Significant other contact made: No, CSW assessing for appropriate contacts  Patient understands diagnosis: Yes  Discussing patient identified problems/goals with staff: Yes  Medical problems stabilized or resolved: Yes  Denies suicidal/homicidal ideation: Yes  Issues/concerns per patient self-inventory: Yes  Other:   New problem(s) identified: N/A   Discharge Plan: 4/12: Pt will discharge home to [place] to live with his family and follow up with [facility] for medication management and therapy   Reason for Continuation of Hospitalization:   Depression   Anxiety   Medication Stabilization   Comments: N/A   Estimated length of stay: 3-5 days    Patient is a 25 year old male admitted under IVC for firing a handgun. Patient lives in a 25 y.o. male with a history of schizophrenia who presented to the ER after he purchased a gun with intent of committing suicide; police were called by neighbors when patient fired his weapon 3 times. Per their IVC papers, when they arrived he raised his weapon at them and told them he did so in hopes that they would shoot him and kill him. He stated he had these suicidal thoughts for "a while".  Urine toxicology screen was positive for cocaine, cannabis. Alcohol level was below the detection limit. Two prior hospitalizations in our unit for psychosis. His last discharge was in the summer of 2006. Then he was treated with Abilify 10 mg oral and receive Abilify injectable. Patient reported that he has not taking any medications or follow-up with psychiatry since then.  Patient reported to the ER psychiatrist that he fired the gun a couple times to be sure that he knew how it worked. He pointed at himself  a couple times but couldn't actually bring himself to shoot himself. By that time police showed up because neighbors had heard the gunshots. Michela Pitcher he wanted to kill himself because people were bothering him. His description of "people bothering him" was that he hears voices talking to him sometimes he can understand them and sometimes he can't. His thought process was described as disorganized while in the emergency department.  Today the patient tells me he is not depressed or suicidal. He tells me he is still hearing voices that tell him random things but he was not able to give me an example. He said the voices sometimes tell him good things. Patient tells me he smokes marijuana about once a week. He says that prior to admission he tried cocaine for the first time.   He denies having any current stressors. He denies problems in the relationship with his family. The patient has been receiving disability for about a year. 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  Patient says he is originally from [place]. He moved to [place] in his senior year of high school. He was living with his father and his father's girlfriend about about a year ago. For the last year he has been living with his younger brother who is 2 years old in [place].  Substance abuse history: Has had a past history of using cannabis but I believe this is the first time he's been identified as  possibly also using cocaine. Patient minimizes it even when I ask him several times he denies that he's been using any drugs.  Gun has been removed by police. Patient will benefit from crisis stabilization, medication evaluation, group therapy, and psycho education in addition to case management for discharge planning. Patient and CSW reviewed pt's identified goals and treatment plan. Pt verbalized understanding and agreed to treatment plan.     Review of  initial/current patient goals per problem list:  1. Goal(s): Patient will participate in aftercare plan   Met: Yes  Target date: 3-5 days post admission date   As evidenced by: Patient will participate within aftercare plan AEB aftercare provider and housing plan at discharge being identified.   4/7: No, CSW assessing for appropriate contacts  4/12: Pt will discharge home to [facility] to live with his family and follow up with [facility] for medication management and therapy     2. Goal (s): Patient will exhibit decreased depressive symptoms and suicidal ideations.   Met: No  Target date: 3-5 days post admission date   As evidenced by: Patient will utilize self-rating of depression at 3 or below and demonstrate decreased signs of depression or be deemed stable for discharge by MD.   4/7: Goal progressing.  4/12: Goal progressing.  Pt denies SI/HI.      3. Goal(s): Patient will demonstrate decreased signs and symptoms of anxiety.   Met: No  Target date: 3-5 days post admission date   As evidenced by: Patient will utilize self-rating of anxiety at 3 or below and demonstrated decreased signs of anxiety, or be deemed stable for discharge by MD   4/7: Goal progressing.  4/12: Goal progressing.     4. Goal(s): Patient will demonstrate decreased signs of withdrawal due to substance abuse   Met: Yes  Target date: 3-5 days post admission date   As evidenced by: Patient will produce a CIWA/COWS score of 0, have stable vitals signs, and no symptoms of withdrawal   4/7: Patient produced a CIWA/COWS score of 0, has stable vitals signs, and no symptoms of withdrawal     5. Goal(s): Patient will demonstrate decreased signs of psychosis  * Met: No * Target date: 3-5 days post admission date  * As evidenced by: Patient will demonstrate decreased frequency of AVH or return to baseline function  4/7: Goal progressing.  4/12: Goal progressing.     Attendees:  Patient: Justin Schaefer Family:  Physician: Dr. Tami Ribas, MD 01/15/2016 9:30 AM  Nursing: Carolynn Sayers, RN 01/15/2016 9:30 AM  Clinical Social Worker: Marylou Flesher, [place] 01/15/2016 9:30 AM  Clinical Social Worker: Nile Riggs, LCSW4/09/2016 9:30 AM  Psychologist: Byrd Hesselbach Roush 01/15/2016 9:30 AM  Other: 01/15/2016 9:30 AM  Other: 01/15/2016 9:30 AM   Alphonse Guild. Laylanie Kruczek, LCSWA, LCAS  01/15/16

## 2016-01-15 NOTE — Plan of Care (Signed)
Problem: Ineffective individual coping Goal: STG: Patient will remain free from self harm Outcome: Not Met (add Reason) Patient has had no self harm since admission.

## 2016-01-15 NOTE — Progress Notes (Signed)
[facility] MD Progress Note  01/15/2016 9:03 AM Justin Schaefer  MRN:  [id number]  Subjective:    Nursing notes reviewed. Pt attended groups.  Calm, cooperative. Continues to hear voices but unable to tell what they are saying.   Today on interview, pt c/o someone having the ability to place a rat on or in his head last night. The pt does not currently feel the rat remains present but this morning he smelled the rat. He noted this is causing him to feel down today. He also discussed feeling as if some people are imposters. He is having difficulty discerning whether some sounds he is hearing are being heard by other people. He described what he is currently hearing to the treatment team and all sounds were determined to be real.   Pt appears concerned and asked this provider if I knew "Who was causing this to happen to him?" The pt explained feeling the government or someone else is causing his issues. He shared feeling someone in the hospital is in another office listening to what he is saying. He feels the person listening to him is "Playing with toys."   The pt frequently pauses and stares off into the distance but denies VH when asked. He continues to hear mumbling but this is improving per the pt. Pt denies SI, HI and VH. Pt is hopeful to be discharged tomorrow but understands his symptoms must improve before this can occur. Pt is willing to increase oral Abilify to manage symptoms.    Principal Problem: Undifferentiated schizophrenia (HCC) Diagnosis:   Patient Active Problem List   Diagnosis Date Noted  . Cocaine use disorder, mild, abuse [F14.10] 01/10/2016  . Cannabis use disorder, moderate, dependence (HCC) [F12.20] 01/10/2016  . Undifferentiated schizophrenia (HCC) [F20.3]   . Tobacco use disorder severe. [F17.200] 02/15/2015   Total Time spent with patient: 30 minutes  Past Psychiatric History: Schizophrenia.  Past Medical History:  Past Medical History  Diagnosis Date  . 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 Schizophrenia [facility])     Past Surgical History  Procedure Laterality Date  . Cochlear implant     Family History: History reviewed. No pertinent family history. Family Psychiatric  History: See H&P. Social History:  History  Alcohol Use No     History  Drug Use  . Yes  . Special: Marijuana    Social History   Social History  . Marital Status: Single    Spouse Name: N/A  . Number of Children: N/A  . Years of Education: N/A   Social History Main Topics  . Smoking status: Current Every Day Smoker -- 0.25 packs/day    Types: Cigarettes  . Smokeless tobacco: None  . Alcohol Use: No  . Drug Use: Yes    Special: Marijuana  . Sexual Activity: No   Other Topics Concern  . None   Social History Narrative   Additional Social History:    History of alcohol / drug use?: Yes  Sleep: Fair  Appetite:  Good  Current Medications: Current Facility-Administered Medications  Medication Dose Route Frequency Provider Last Rate Last Dose  . acetaminophen (TYLENOL) tablet 650 mg  650 mg Oral Q6H PRN Audery Amel, MD      . alum & mag hydroxide-simeth (MAALOX/MYLANTA) 200-200-20 MG/5ML suspension 30 mL  30 mL Oral Q4H PRN Audery Amel, MD      . ARIPiprazole (ABILIFY) tablet 20 mg  20 mg Oral Daily Audery Amel, MD  20 mg at 01/14/16 0845  . ARIPiprazole SUSR 400 mg  400 mg Intramuscular Q30 days Jimmy FootmanAndrea Hernandez-Gonzalez, MD   400 mg at 01/10/16 1726  . magnesium hydroxide (MILK OF MAGNESIA) suspension 30 mL  30 mL Oral Daily PRN Audery AmelJohn T Clapacs, MD      . nicotine (NICODERM CQ - dosed in mg/24 hours) patch 14 mg  14 mg Transdermal Daily Jimmy FootmanAndrea Hernandez-Gonzalez, MD   14 mg at 01/14/16 0848  . traZODone (DESYREL) tablet 75 mg  75 mg Oral QHS PRN Gena Frayyan G Jen Eppinger, MD   75 mg at 01/14/16 2141    Lab Results: No results found for this or any previous visit (from the past 48 hour(s)).  Blood Alcohol level:  Lab Results  Component Value Date   ETH  <5 01/08/2016   ETH <5 02/13/2015    Physical Findings: AIMS: 0  Dental Status Current problems with teeth and/or dentures?: No Does patient usually wear dentures?: No  CIWA:    COWS:     Musculoskeletal: Strength & Muscle Tone: within normal limits Gait & Station: normal Patient leans: N/A  Psychiatric Specialty Exam: Review of Systems  Psychiatric/Behavioral: Positive for hallucinations. Negative for depression, suicidal ideas, memory loss and substance abuse. The patient is not nervous/anxious and does not have insomnia.   All other systems reviewed and are negative.   Blood pressure 124/87, pulse 77, temperature 98 F (36.7 C), temperature source Oral, resp. rate 18, height 5\' 4"  (1.626 m), weight 55.339 kg (122 lb), SpO2 99 %.Body mass index is 20.93 kg/(m^2).  General Appearance: Casual  Eye Contact::  Fair, pt stares out of the window at times but does not appear to respond to internal stimuli.   Speech:  Clear and Coherent but with latency of speech noted.   Volume:  Normal  Mood:  "I'm feeling down today."   Affect:  Restricted   Thought Process:  Coherent, Goal Directed, Intact, Linear and Logical  Orientation:  Full (Time, Place, and Person)  Thought Content:  Hallucinations: Auditory and Paranoid Ideation  Suicidal Thoughts:  No  Homicidal Thoughts:  No  Memory:  Immediate;   Fair Recent;   Fair Remote;   Fair  Judgement:  Fair  Insight:  Fair  Psychomotor Activity:  Normal  Concentration:  Good  Recall:  [employer]  Fund of Knowledge:Fair  Language: Fair  Akathisia:  No  Handed:  Right  AIMS (if indicated):     Assets:  Communication Skills Desire for Improvement Financial Resources/Insurance Housing Physical Health Resilience Social Support  ADL's:  Intact  Cognition: WNL  Sleep:  Number of Hours: 7   Treatment Plan Summary: Daily contact with patient to assess and evaluate symptoms and progress in treatment and Medication management   Justin Schaefer  is a 25 year old male with a history of schizophrenia admitted for worsening of depression and suicide attempt by a gun and provoking suicide by police. Pt seems to have regressed slightly. Please see HPI for details.   1. Suicidal ideation. The patient is able to contract for safety.  2. Mood and psychosis.   - Increase Abilify to 25mg  po Qam.   - Continue Abilify Maintena injection for psychosis.   3. Insomnia: improved  - Continue trazodone to 75mg  po QHS due to vivid dreams.   4.Smoking. He is on the nicotine patch.   5. Substance abuse. The patient minimizes his problems and is not interested in substance abuse treatment at this point.   6.  Precautions every 15 minute checks.  7. Hospitalization status: voluntary   8. Diet regular  9. Discharge disposition patient will return to his home in [place], [place] where he lives with his brother. He may be moving in with his father.   10. Discharge follow-up: Patient reports having Medicaid and receives disability. Patient will need to be connected with [facility] were he should be referred for community support team.   We ordered double portions 01/12/16.   Gena Fray, MD 01/15/2016, 9:03 AM

## 2016-01-15 NOTE — Plan of Care (Signed)
Problem: Alteration in thought process Goal: STG-Patient is able to follow short directions Outcome: Progressing Patient was told to come to the med room when he was ready to take his night meds and he was able to do so.

## 2016-01-16 MED ORDER — ARIPIPRAZOLE 15 MG PO TABS
30.0000 mg | ORAL_TABLET | Freq: Every day | ORAL | Status: DC
  Administered 2016-01-17 – 2016-01-21 (×5): 30 mg via ORAL
  Filled 2016-01-16 (×5): qty 2

## 2016-01-16 NOTE — Progress Notes (Signed)
D: Pt denies SI/HI but endorsing AVH, pt stated " voices are loud and bothering him' patient was given emotional support and medicated as needed. Pt is pleasant and cooperative, affect is flat ad sad,  Pt appears anxious and he is not  interacting with peers and staff appropriately.  A: Pt was offered support and encouragement. Pt was given scheduled medications. Pt was encouraged to attend groups. Q 15 minute checks were done for safety.  R:Pt did not attend group. Pt is taking medication. Pt receptive to treatment and safety maintained on unit.

## 2016-01-16 NOTE — Progress Notes (Signed)
[facility] MD Progress Note  01/16/2016 11:53 AM Justin Schaefer  MRN:  [id number]  Subjective:    Nursing notes reviewed.   Today on interview, pt tends to minimize his hallucinations. He is pacing more around the unit and in recreation therapy group which, he says, helps distract him from hallucinations. He is also laughing inappropriately to himself. After discussion about continued hospitalization, the pt admits to [facility] and mumbling. He continues to feel someone is listening to his conversations but the pt shared "It's probably a friend or something." Pt currently denies SI & HI. Pt did not directly answer in regards to [facility]. Pt also admitted to [facility] worker that he saw a ball earlier today which was not there.    Principal Problem: Undifferentiated schizophrenia (HCC) Diagnosis:   Patient Active Problem List   Diagnosis Date Noted  . Cocaine use disorder, mild, abuse [F14.10] 01/10/2016  . Cannabis use disorder, moderate, dependence (HCC) [F12.20] 01/10/2016  . Undifferentiated schizophrenia (HCC) [F20.3]   . Tobacco use disorder severe. [F17.200] 02/15/2015   Total Time spent with patient: 15 minutes  Past Psychiatric History: Schizophrenia.  Past Medical History:  Past Medical History  Diagnosis Date  . 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 Schizophrenia [facility])     Past Surgical History  Procedure Laterality Date  . Cochlear implant     Family History: History reviewed. No pertinent family history. Family Psychiatric  History: See H&P. Social History:  History  Alcohol Use No     History  Drug Use  . Yes  . Special: Marijuana    Social History   Social History  . Marital Status: Single    Spouse Name: N/A  . Number of Children: N/A  . Years of Education: N/A   Social History Main Topics  . Smoking status: Current Every Day Smoker -- 0.25 packs/day    Types: Cigarettes  . Smokeless tobacco: None  . Alcohol Use: No  . Drug Use: Yes    Special: Marijuana  . Sexual  Activity: No   Other Topics Concern  . None   Social History Narrative   Additional Social History:    History of alcohol / drug use?: Yes  Sleep: Fair  Appetite:  Good  Current Medications: Current Facility-Administered Medications  Medication Dose Route Frequency Provider Last Rate Last Dose  . acetaminophen (TYLENOL) tablet 650 mg  650 mg Oral Q6H PRN Audery Amel, MD      . alum & mag hydroxide-simeth (MAALOX/MYLANTA) 200-200-20 MG/5ML suspension 30 mL  30 mL Oral Q4H PRN Audery Amel, MD      . ARIPiprazole (ABILIFY) tablet 25 mg  25 mg Oral Daily Gena Fray, MD      . ARIPiprazole SUSR 400 mg  400 mg Intramuscular Q30 days Jimmy Footman, MD   400 mg at 01/10/16 1726  . magnesium hydroxide (MILK OF MAGNESIA) suspension 30 mL  30 mL Oral Daily PRN Audery Amel, MD      . nicotine (NICODERM CQ - dosed in mg/24 hours) patch 14 mg  14 mg Transdermal Daily Jimmy Footman, MD   14 mg at 01/15/16 1021  . traZODone (DESYREL) tablet 75 mg  75 mg Oral QHS PRN Gena Fray, MD   75 mg at 01/15/16 2137    Lab Results: No results found for this or any previous visit (from the past 48 hour(s)).  Blood Alcohol level:  Lab Results  Component Value Date  ETH <5 01/08/2016   ETH <5 02/13/2015    Physical Findings: AIMS: 0  Dental Status Current problems with teeth and/or dentures?: No Does patient usually wear dentures?: No  CIWA:    COWS:     Musculoskeletal: Strength & Muscle Tone: within normal limits Gait & Station: normal Patient leans: N/A  Psychiatric Specialty Exam: Review of Systems  Psychiatric/Behavioral: Positive for hallucinations. Negative for depression, suicidal ideas, memory loss and substance abuse. The patient is not nervous/anxious and does not have insomnia.   All other systems reviewed and are negative.   Blood pressure 138/82, pulse 82, temperature 98 F (36.7 C), temperature source Oral, resp. rate 20, height 5\' 4"   (1.626 m), weight 55.339 kg (122 lb), SpO2 99 %.Body mass index is 20.93 kg/(m^2).  General Appearance: Casual  Eye Contact::  Poor  Speech:  Clear and Coherent but with latency of speech noted.   Volume:  Normal  Mood:  "I feel fine."   Affect:  Restricted   Thought Process:  Coherent, Goal Directed, Intact, Linear and Logical  Orientation:  Full (Time, Place, and Person)  Thought Content:  Hallucinations: Auditory and Paranoid Ideation  Suicidal Thoughts:  No  Homicidal Thoughts:  No  Memory:  Immediate;   Fair Recent;   Fair Remote;   Fair  Judgement:  Fair  Insight:  Fair  Psychomotor Activity:  Normal  Concentration:  Fair  Recall:  FiservFair  Fund of Knowledge:Fair  Language: Fair  Akathisia:  No  Handed:  Right  AIMS (if indicated):     Assets:  Communication Skills Desire for Improvement Financial Resources/Insurance Housing Physical Health Resilience Social Support  ADL's:  Intact  Cognition: WNL  Sleep:  Number of Hours: 7   Treatment Plan Summary: Daily contact with patient to assess and evaluate symptoms and progress in treatment and Medication management   Justin Schaefer is a 25 year old male with a history of schizophrenia admitted for worsening of depression and suicide attempt by a gun and provoking suicide by police. Pt seems regress. Will increase Abilify as per below.   1. Suicidal ideation. The patient is able to contract for safety.  2. Mood and psychosis.   - Increase Abilify to 30mg  po Qam on 01-17-16.   - Continue Abilify Maintena injection for psychosis.   3. Insomnia: improved  - Continue trazodone to 75mg  po QHS due to vivid dreams.   4.Smoking. He is on the nicotine patch.   5. Substance abuse. The patient minimizes his problems and is not interested in substance abuse treatment at this point.   6. Precautions every 15 minute checks.  7. Hospitalization status: voluntary   8. Diet regular  9. Discharge disposition patient will return to  his home in [place]  where he lives with his brother. He may be moving in with his father.   10. Discharge follow-up: Patient reports having Medicaid and receives disability. Patient will need to be connected with [facility] were he should be referred for community support team.   We ordered double portions 01/12/16.   Gena FrayMcQueen, Keirston Saephanh G, MD 01/16/2016, 11:53 AM

## 2016-01-16 NOTE — Progress Notes (Signed)
Patient states that she has an itch in his groin area and that he keeps getting an erection that he cannot control.  Further states that he has a rash.  Checked groin area.  No rash noted although genital area looks moist.  Instructed patient to ensure that after shower he drys himself thoroughly.  Barrier cream given to use.  Dr. Jenne CampusMcQueen informed.  No further orders given.

## 2016-01-16 NOTE — [facility] Group Notes (Signed)
[facility] LCSW Group Therapy  01/16/2016 10:34 AM  Type of Therapy:  Group Therapy  Participation Level:  Minimal  Participation Quality:  Appropriate  Affect:  Flat  Cognitive:  Alert and Appropriate  Insight:  Limited  Engagement in Therapy:  Limited  Modes of Intervention:  Discussion, Education, Socialization and Support  Summary of Progress/Problems: Balance in life: Patients will discuss the concept of balance and how it looks and feels to be unbalanced. Pt will identify areas in their life that is unbalanced and ways to become more balanced. Pt attended group, was appropriate, and listened to group members share.     Jenel LucksJasmine Lewis, CSW Intern 01/16/2016, 10:34 AM

## 2016-01-16 NOTE — Plan of Care (Signed)
Problem: Alteration in thought process Goal: LTG-Patient behavior demonstrates decreased signs psychosis (Patient behavior demonstrates decreased signs of psychosis to the point the patient is safe to return home and continue treatment in an outpatient setting.)  Outcome: Progressing Patient demonstrate less psychosis.

## 2016-01-16 NOTE — [facility] Group Notes (Signed)
[facility] Group Notes:  (Nursing/MHT/Case Management/Adjunct)  Date:  01/16/2016  Time:  4:58 PM  Type of Therapy:  Psychoeducational Skills  Participation Level:  Did Not Attend  Twanna Hymanda C Jeneen Doutt 01/16/2016, 4:58 PM

## 2016-01-17 NOTE — [facility] Group Notes (Signed)
[facility] LCSW Group Therapy  01/17/2016 1:54 PM  Type of Therapy:  Group Therapy  Participation Level:  Minimal  Participation Quality:  Attentive  Affect:  Flat  Cognitive:  Lacking  Insight:  Limited  Engagement in Therapy:  Limited  Modes of Intervention:  Discussion, Education, Socialization and Support  Summary of Progress/Problems: Feelings around Relapse. Group members discussed the meaning of relapse and shared personal stories of relapse, how it affected them and others, and how they perceived themselves during this time. Group members were encouraged to identify triggers, warning signs and coping skills used when facing the possibility of relapse. Social supports were discussed and explored in detail. Pt attended group and stayed the entire time. Pt sat quietly and listened to other group members share.    Justin Schaefer L Jaydyn Bozzo MSW, LCSWA  01/17/2016, 1:54 PM   

## 2016-01-17 NOTE — Progress Notes (Signed)
Calm and cooperative. Flat affect. Hyper religious. Refused group. Endorses AV/H. Trazodone 75 mg po given at 2157 PRN. No behavior problems noted. No c/o pain/discomfort noted.

## 2016-01-17 NOTE — Tx Team (Signed)
Interdisciplinary Treatment Plan Update (Adult)        Date: 01/17/2016   Time Reviewed: 9:30 AM   Progress in Treatment: Improving  Attending groups: Yes  Participating in groups: Yes Taking medication as prescribed: Yes  Tolerating medication: Yes  Family/Significant other contact made: Yes, CSW spoke to the pt's brother  Patient understands diagnosis: Yes  Discussing patient identified problems/goals with staff: Yes  Medical problems stabilized or resolved: Yes  Denies suicidal/homicidal ideation: Yes  Issues/concerns per patient self-inventory: Yes  Other:   New problem(s) identified: N/A   Discharge: Pt will discharge home to [facility] to live with his family and follow up with [facility] for medication management and therapy   Reason for Continuation of Hospitalization:   Depression   Anxiety   Medication Stabilization   Comments: N/A   Estimated date of discharge: 01/20/16    Patient is a 25 year old male admitted under IVC for firing a handgun. Patient lives in a 25 y.o. male with a history of schizophrenia who presented to the ER after he purchased a gun with intent of committing suicide; police were called by neighbors when patient fired his weapon 3 times. Per their IVC papers, when they arrived he raised his weapon at them and told them he did so in hopes that they would shoot him and kill him. He stated he had these suicidal thoughts for "a while".  Urine toxicology screen was positive for cocaine, cannabis. Alcohol level was below the detection limit. Two prior hospitalizations in our unit for psychosis. His last discharge was in the summer of 2006. Then he was treated with Abilify 10 mg oral and receive Abilify injectable. Patient reported that he has not taking any medications or follow-up with psychiatry since then.  Patient reported to the ER psychiatrist that he fired the gun a couple times to be sure that he knew how it worked. He pointed at himself a couple times  but couldn't actually bring himself to shoot himself. By that time police showed up because neighbors had heard the gunshots. Michela Pitcher he wanted to kill himself because people were bothering him. His description of "people bothering him" was that he hears voices talking to him sometimes he can understand them and sometimes he can't. His thought process was described as disorganized while in the emergency department.    Today the patient tells me he is not depressed or suicidal. He tells me he is still hearing voices that tell him random things but he was not able to give me an example. He said the voices sometimes tell him good things. Patient tells me he smokes marijuana about once a week. He says that prior to admission he tried cocaine for the first time.   He denies having any current stressors. He denies problems in the relationship with his family. The patient has been receiving disability for about a year. 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  Patient says he is originally from [place]. He moved to [place] in his senior year of high school. He was living with his father and his father's girlfriend about about a year ago.  For the last year he has been living with his younger brother who is 32 years old in [place].  Substance abuse history: Has had a past history of using cannabis but I believe this is the first time he's been identified  as possibly also using cocaine. Patient minimizes it even when I ask him several times he denies that he's been using any drugs.  Gun has been removed by police. Patient will benefit from crisis stabilization, medication evaluation, group therapy, and psycho education in addition to case management for discharge planning. Patient and CSW reviewed pt's identified goals and treatment plan. Pt verbalized understanding and agreed to treatment plan.     Review of initial/current  patient goals per problem list:  1. Goal(s): Patient will participate in aftercare plan   Met: Yes  Target date: 3-5 days post admission date   As evidenced by: Patient will participate within aftercare plan AEB aftercare provider and housing plan at discharge being identified.   4/7: No, CSW assessing for appropriate contacts  4/12: Pt will discharge home to [facility] to live with his family and follow up with [facility] for medication management and therapy     2. Goal (s): Patient will exhibit decreased depressive symptoms and suicidal ideations.   Met: No  Target date: 3-5 days post admission date   As evidenced by: Patient will utilize self-rating of depression at 3 or below and demonstrate decreased signs of depression or be deemed stable for discharge by MD.   4/7: Goal progressing.  4/12: Goal progressing.  Pt denies SI/HI.    4/14: 4/12: Goal progressing.  Pt denies SI/HI.      3. Goal(s): Patient will demonstrate decreased signs and symptoms of anxiety.   Met: No  Target date: 3-5 days post admission date   As evidenced by: Patient will utilize self-rating of anxiety at 3 or below and demonstrated decreased signs of anxiety, or be deemed stable for discharge by MD   4/7: Goal progressing.  4/12: Goal progressing.  4/14: Goal progressing.    4. Goal(s): Patient will demonstrate decreased signs of withdrawal due to substance abuse   Met: Yes  Target date: 3-5 days post admission date   As evidenced by: Patient will produce a CIWA/COWS score of 0, have stable vitals signs, and no symptoms of withdrawal   4/7: Patient produced a CIWA/COWS score of 0, has stable vitals signs, and no symptoms of withdrawal     5. Goal(s): Patient will demonstrate decreased signs of psychosis  * Met: No * Target date: 3-5 days post admission date  * As evidenced by: Patient will demonstrate decreased frequency of AVH or return to baseline function  4/7: Goal progressing.  4/12:  Goal progressing.   4/14: Goal progressing.   Attendees:  Patient: Justin Schaefer  Family:  Physician: Dr. Tami Ribas, MD    01/17/2016 9:30 AM  Nursing: Elige Radon, RN     01/17/2016 9:30 AM  Clinical Social Worker: Marylou Flesher, [place]  01/17/2016 9:30 AM  Clinical Social Worker: [place], [place]  01/17/2016 9:30 AM  Psychologist: Consuella Lose   01/17/2016 9:30 AM  Nursing:  Floyde Parkins, RN     01/17/2016 9:30 AM  Other:        01/17/2016 9:30 AM   Alphonse Guild. Starleen Trussell, LCSWA, LCAS  01/20/16

## 2016-01-17 NOTE — Progress Notes (Signed)
Patient still endorsing +ve Auditory hallucinations and states that the voices just repeat the same thing over and over again. He however says that the voices have decreased some. Patient also seen laying down on his stomach on the floor in his room doing some crossword puzzles. States that today's a better day for him. Seen in groups but without any interaction with peers nor staff. Medication education done. He verbalizes understanding.

## 2016-01-17 NOTE — [facility] Group Notes (Signed)
[facility] Group Notes:  (Nursing/MHT/Case Management/Adjunct)  Date:  01/17/2016  Time:  2:37 AM  Type of Therapy:  Group Therapy  Participation Level:  Did Not Attend   Summary of Progress/Problems:  Justin Schaefer 01/17/2016, 2:37 AM

## 2016-01-17 NOTE — Plan of Care (Signed)
Problem: Ineffective individual coping Goal: STG: Patient will remain free from self harm Outcome: Progressing Patient denied SI and contracted for safety.

## 2016-01-17 NOTE — Progress Notes (Signed)
[facility] MD Progress Note  01/17/2016 10:22 AM TRAESON DUSZA  MRN:  [id number]  Subjective:    Nursing notes reviewed.   Today on interview, Brinden is resting in bed and is easily awakened. He shared he is eating & sleeping well.   When asked about AH and hearing mumbling, the pt shared he does not really hear this right now. He continues to minimize his symptoms and asks about discharge. Pt states he does not feel anyone or anything is communicating with him through his cochlear implant.   Currently, pt denies medication side effects including TD, EPS and akathisia. Pt does not appear to respond to internal stimuli at the time of this interview. Pt denies SI, HI and AVH.    Principal Problem: Undifferentiated schizophrenia (HCC) Diagnosis:   Patient Active Problem List   Diagnosis Date Noted  . Cocaine use disorder, mild, abuse [F14.10] 01/10/2016  . Cannabis use disorder, moderate, dependence (HCC) [F12.20] 01/10/2016  . Undifferentiated schizophrenia (HCC) [F20.3]   . Tobacco use disorder severe. [F17.200] 02/15/2015   Total Time spent with patient: 15 minutes  Past Psychiatric History: Schizophrenia.  Past Medical History:  Past Medical History  Diagnosis Date  . 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 Schizophrenia [facility])     Past Surgical History  Procedure Laterality Date  . Cochlear implant     Family History: History reviewed. No pertinent family history. Family Psychiatric  History: See H&P. Social History:  History  Alcohol Use No     History  Drug Use  . Yes  . Special: Marijuana    Social History   Social History  . Marital Status: Single    Spouse Name: N/A  . Number of Children: N/A  . Years of Education: N/A   Social History Main Topics  . Smoking status: Current Every Day Smoker -- 0.25 packs/day    Types: Cigarettes  . Smokeless tobacco: None  . Alcohol Use: No  . Drug Use: Yes    Special: Marijuana  . Sexual Activity: No   Other Topics  Concern  . None   Social History Narrative   Additional Social History:    History of alcohol / drug use?: Yes  Sleep: Fair  Appetite:  Good  Current Medications: Current Facility-Administered Medications  Medication Dose Route Frequency Provider Last Rate Last Dose  . acetaminophen (TYLENOL) tablet 650 mg  650 mg Oral Q6H PRN Audery Amel, MD      . alum & mag hydroxide-simeth (MAALOX/MYLANTA) 200-200-20 MG/5ML suspension 30 mL  30 mL Oral Q4H PRN Audery Amel, MD   30 mL at 01/16/16 1404  . ARIPiprazole (ABILIFY) tablet 30 mg  30 mg Oral Daily Gena Fray, MD   30 mg at 01/17/16 0926  . ARIPiprazole SUSR 400 mg  400 mg Intramuscular Q30 days Jimmy Footman, MD   400 mg at 01/10/16 1726  . magnesium hydroxide (MILK OF MAGNESIA) suspension 30 mL  30 mL Oral Daily PRN Audery Amel, MD      . nicotine (NICODERM CQ - dosed in mg/24 hours) patch 14 mg  14 mg Transdermal Daily Jimmy Footman, MD   14 mg at 01/17/16 0927  . traZODone (DESYREL) tablet 75 mg  75 mg Oral QHS PRN Gena Fray, MD   75 mg at 01/16/16 2157    Lab Results: No results found for this or any previous visit (from the past 48 hour(s)).  Blood Alcohol level:  Lab  Results  Component Value Date   [facility]   ETH <5 02/13/2015    Physical Findings: AIMS: Not assessed today.   CIWA:    COWS:     Musculoskeletal: Strength & Muscle Tone: within normal limits Gait & Station: normal Patient leans: N/A  Psychiatric Specialty Exam: Review of Systems  Psychiatric/Behavioral: Positive for hallucinations. Negative for depression, suicidal ideas, memory loss and substance abuse. The patient is not nervous/anxious and does not have insomnia.   All other systems reviewed and are negative.   Blood pressure 139/87, pulse 87, temperature 97.7 F (36.5 C), temperature source Oral, resp. rate 16, height 5\' 4"  (1.626 m), weight 55.339 kg (122 lb), SpO2 99 %.Body mass index is 20.93  kg/(m^2).  General Appearance: Casual  Eye Contact::  Fair  Speech:  Clear and Coherent but with less latency of speech noted.   Volume:  Normal  Mood:  "I feel fine."   Affect:  Restricted   Thought Process:  Coherent, Goal Directed, Intact, Linear and Logical  Orientation:  Full (Time, Place, and Person)  Thought Content:  Hallucinations: Auditory and non-command. Denies thought withdrawal and paranoia.   Suicidal Thoughts:  No  Homicidal Thoughts:  No  Memory:  Immediate;   Fair Recent;   Fair Remote;   Fair  Judgement:  Fair  Insight:  Fair  Psychomotor Activity:  Normal  Concentration:  Fair  Recall:  Fis[employer]Fair  Fund of Knowledge:Fair  Language: Fair  Akathisia:  No  Handed:  Right  AIMS (if indicated):     Assets:  Communication Skills Desire for Improvement Financial Resources/Insurance Housing Physical Health Resilience Social Support  ADL's:  Intact  Cognition: WNL  Sleep:  Number of Hours: 7.45   Treatment Plan Summary: Daily contact with patient to assess and evaluate symptoms and progress in treatment and Medication management   [name] is a 25 year old male with a history of schizophrenia admitted for worsening of depression and suicide attempt by a gun and provoking suicide by police. Pt seems regress. Will continue with current treatment plan.   1. Suicidal ideation. The patient is able to contract for safety.  2. Mood and psychosis.   - Continue Abilify to 30mg  po Qam on 01-17-16.   - Continue Abilify Maintena injection for psychosis.   3. Insomnia: improved  - Continue trazodone to 75mg  po QHS due to vivid dreams.   4.Smoking. He is on the nicotine patch.   5. Substance abuse. The patient minimizes his problems and is not interested in substance abuse treatment at this point.   6. Precautions every 15 minute checks.  7. Hospitalization status: voluntary   8. Diet regular  9. Discharge disposition patient will return to [place] where he lives with his brother. He may be moving in with his father.   - Discharge on April 17/18, 2017.   10. Discharge follow-up: Patient reports having Medicaid and receives disability. Patient will need to be connected with [facility] were he should be referred for community support team.   We ordered double portions 01/12/16.   Gena FrayMcQueen, Niko Jakel G, MD 01/17/2016, 10:22 AM

## 2016-01-17 NOTE — [facility] Group Notes (Signed)
[facility] LCSW Aftercare Discharge Planning Group Note   01/17/2016 10:46 AM  Participation Quality:  Did not attend   Pernella Ackerley L Kwinton Maahs MSW, LCSWA    

## 2016-01-18 NOTE — Progress Notes (Signed)
D: Pt pleasant and cooperative during assessment. Denies SI. Endorses AH but cannot "understand what they are saying". Visible in milieu and appropriate during interactions. A: Encouragement and support provided. Q15 minute checks maintained for safety. Medications given as prescribed. R: Remains safe on unit. Voices no additional concerns. Will continue to monitor.

## 2016-01-18 NOTE — Plan of Care (Signed)
Problem: Ineffective individual coping Goal: LTG: Patient will report a decrease in negative feelings Outcome: Progressing Patient reports feeling better today than he did yesterday     

## 2016-01-18 NOTE — Progress Notes (Signed)
D:  Patient is alert and oriented on the unit this shift.  Patient did not attend groups today.  Patient denies suicidal ideation, homicidal ideation or visual hallucinations at the present time.  Patient still endorses auditory hallucinations that are not command in nature.  Patient also states "it's like smells"  A:  Scheduled medications are administered to patient as per MD orders.  Emotional support and encouragement are provided.  Patient is maintained on q.15 minute safety checks.  Patient is informed to notify staff with questions or concerns. R:  No adverse medication reactions are noted.  Patient is cooperative with medication administration and treatment plan today.  Patient is receptive, calm and cooperative on the unit at this time.  Patient interacts minimally with others on the unit this shift.  Patient contracts for safety at this time.  Patient remains safe at this time.

## 2016-01-18 NOTE — Plan of Care (Signed)
Problem: Alteration in thought process Goal: STG-Patient is able to follow short directions Outcome: Progressing Patient follows commands without difficulty currently

## 2016-01-18 NOTE — [facility] Group Notes (Signed)
[facility] LCSW Group Therapy  01/18/2016 3:10 PM  Type of Therapy:  Group Therapy  Participation Level:  Active  Participation Quality:  Attentive  Affect:  Flat  Cognitive:  Alert  Insight:  Limited  Engagement in Therapy:  Limited  Modes of Intervention:  Discussion, Education, Socialization and Support  Summary of Progress/Problems: Boundaries: Patients defined boundaries and discussed the importance of them. Patients identified their own boundaries and how they feel when they are crossed. Patients discussed ways to create and/ or improve their personal boundaries.  Pt attended group and stayed the entire time. He discussed developing better communication skills as a way to develop healthier boundaries.   Eiliana Drone L Charna Neeb MSW, LCSWA  01/18/2016, 3:10 PM

## 2016-01-18 NOTE — Progress Notes (Signed)
Patient ID: Justin Schaefer, male   DOB: May 24, 1991, 25 y.o.   MRN: [id number] [facility]  01/18/2016 10:22 AM Justin Schaefer  MRN:  [id number]  Subjective:    Patient was seen this morning in the hallway. He stated he was doing well and he just paces up and down. He denied hearing voices. He speaks in a very low tone and there is minimal spontaneous speech. He has not been disruptive on the unit and pretty much stays to the room. He denied any types of hallucinations.   Currently, pt denies medication side effects including TD, EPS and akathisia. Pt does not appear to respond to internal stimuli at the time of this interview. Pt denies SI, HI and AVH.    Principal Problem: Undifferentiated schizophrenia (HCC) Diagnosis:   Patient Active Problem List   Diagnosis Date Noted  . Cocaine use disorder, mild, abuse [F14.10] 01/10/2016  . Cannabis use disorder, moderate, dependence (HCC) [F12.20] 01/10/2016  . Undifferentiated schizophrenia (HCC) [F20.3]   . Tobacco use disorder severe. [F17.200] 02/15/2015   Total Time spent with patient: 15 minutes  Past Psychiatric History: Schizophrenia.  Past Medical History:  Past Medical History  Diagnosis Date  . 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 Schizophrenia [facility](HCC)     Past Surgical History  Procedure Laterality Date  . Cochlear implant     Family History: History reviewed. No pertinent family history. Family Psychiatric  History: See H&P. Social History:  History  Alcohol Use No     History  Drug Use  . Yes  . Special: Marijuana    Social History   Social History  . Marital Status: Single    Spouse Name: N/A  . Number of Children: N/A  . Years of Education: N/A   Social History Main Topics  . Smoking status: Current Every Day Smoker -- 0.25 packs/day    Types: Cigarettes  . Smokeless tobacco: None  . Alcohol Use: No  . Drug Use: Yes    Special: Marijuana  . Sexual Activity: No   Other Topics Concern   . None   Social History Narrative   Additional Social History:    History of alcohol / drug use?: Yes  Sleep: Fair  Appetite:  Good  Current Medications: Current Facility-Administered Medications  Medication Dose Route Frequency Provider Last Rate Last Dose  . acetaminophen (TYLENOL) tablet 650 mg  650 mg Oral Q6H PRN Audery AmelJohn T Clapacs, MD      . alum & mag hydroxide-simeth (MAALOX/MYLANTA) 200-200-20 MG/5ML suspension 30 mL  30 mL Oral Q4H PRN Audery AmelJohn T Clapacs, MD   30 mL at 01/16/16 1404  . ARIPiprazole (ABILIFY) tablet 30 mg  30 mg Oral Daily Gena Frayyan G McQueen, MD   30 mg at 01/18/16 0856  . ARIPiprazole SUSR 400 mg  400 mg Intramuscular Q30 days Jimmy FootmanAndrea Hernandez-Gonzalez, MD   400 mg at 01/10/16 1726  . magnesium hydroxide (MILK OF MAGNESIA) suspension 30 mL  30 mL Oral Daily PRN Audery AmelJohn T Clapacs, MD      . nicotine (NICODERM CQ - dosed in mg/24 hours) patch 14 mg  14 mg Transdermal Daily Jimmy FootmanAndrea Hernandez-Gonzalez, MD   14 mg at 01/18/16 0857  . traZODone (DESYREL) tablet 75 mg  75 mg Oral QHS PRN Gena Frayyan G McQueen, MD   75 mg at 01/16/16 2157    Lab Results: No results found for this or any previous visit (from the past 48 hour(s)).  Blood Alcohol  level:  Lab Results  Component Value Date   [facility] <5 01/08/2016   ETH <5 02/13/2015    Physical Findings: AIMS: Not assessed today.   CIWA:    COWS:     Musculoskeletal: Strength & Muscle Tone: within normal limits Gait & Station: normal Patient leans: N/A  Psychiatric Specialty Exam: Review of Systems  Psychiatric/Behavioral: Positive for hallucinations. Negative for depression, suicidal ideas, memory loss and substance abuse. The patient is not nervous/anxious and does not have insomnia.   All other systems reviewed and are negative.   Blood pressure 137/76, pulse 80, temperature 98.2 F (36.8 C), temperature source Oral, resp. rate 20, height  (1.626 m), weight 122 lb (55.339 kg), SpO2 99 %.Body mass index is 20.93  kg/(m^2).  General Appearance: Casual  Eye Contact::  Fair  Speech:  Minimal spontaneous speech   Volume:  Normal  Mood:  "I feel fine."   Affect:  Restricted   Thought Process:  Coherent, Goal Directed, Intact, Linear and Logical  Orientation:  Full (Time, Place, and Person)  Thought Content:  Hallucinations: Auditory and non-command. Denies thought withdrawal and paranoia.   Suicidal Thoughts:  No  Homicidal Thoughts:  No  Memory:  Immediate;   Fair Recent;   Fair Remote;   Fair  Judgement:  Fair  Insight:  Fair  Psychomotor Activity:  Normal  Concentration:  Fair  Recall:  [employer] of Knowledge:Fair  Language: Fair  Akathisia:  No  Handed:  Right  AIMS (if indicated):     Assets:  Communication Skills Desire for Improvement Financial Resources/Insurance Housing Physical Health Resilience Social Support  ADL's:  Intact  Cognition: WNL  Sleep:  Number of Hours: 6.45   Treatment Plan Summary: Daily contact with patient to assess and evaluate symptoms and progress in treatment and Medication management   Mr. Oldaker is a 25 year old male with a history of schizophrenia admitted for worsening of depression and suicide attempt by a gun and provoking suicide by police. Pt seems regress. Will continue with current treatment plan.   1. Suicidal ideation. The patient is able to contract for safety.  2. Mood and psychosis.   - Continue Abilify to  po Qam on 01-17-16.   - Continue Abilify Maintena injection for psychosis.   3. Insomnia: improved  - Continue trazodone to  po QHS due to vivid dreams.   4.Smoking. He is on the nicotine patch.   5. Substance abuse. The patient minimizes his problems and is not interested in substance abuse treatment at this point.   6. Precautions every 15 minute checks.  7. Hospitalization status: voluntary   8. Diet regular  9. Discharge disposition patient will return to his home in [place], [place] where he lives  with his brother. He may be moving in with his father.   - Discharge on April 17/18, 2017.   10. Discharge follow-up: Patient reports having Medicaid and receives disability. Patient will need to be connected with [facility] were he should be referred for community support team.   We ordered double portions 01/12/16.   Patrick North, MD 01/18/2016, 10:22 AM

## 2016-01-19 NOTE — Plan of Care (Signed)
Problem: Ineffective individual coping Goal: STG: Patient will participate in after care plan Outcome: Progressing Pt is progressing with learing new coping skills.

## 2016-01-19 NOTE — Plan of Care (Signed)
Problem: Ineffective individual coping Goal: LTG: Patient will report a decrease in negative feelings Outcome: Progressing Still endorsing some +ve Auditory Hallucinations but states that they have decreased.

## 2016-01-19 NOTE — Progress Notes (Signed)
Patient ID: Justin Schaefer, male   DOB: [birth year], 23 y.o.   MRN: [id number]  [facility] MD Progress Note  01/19/2016 12:53 PM Justin Schaefer  MRN:  [id number]  Subjective:    Patient was seen this morning as he was pacing up and down. Patient was dressed in his clothes today and appeared cleaner. He stated that he was doing much better. His speech was more clear and patient appeared with more animated affect today. States he slept well and has been eating well. He denies any suicidal thoughts. States his mood is improving. He did call this clinician back and say that the cochlear implant in his head is sending waves and he would like to get it taken out.   Currently, pt denies medication side effects including TD, EPS and akathisia. Pt does not appear to respond to internal stimuli at the time of this interview. Pt denies SI, HI and AVH.    Principal Problem: Undifferentiated schizophrenia (HCC) Diagnosis:   Patient Active Problem List   Diagnosis Date Noted  . Cocaine use disorder, mild, abuse [F14.10] 01/10/2016  . Cannabis use disorder, moderate, dependence (HCC) [F12.20] 01/10/2016  . Undifferentiated schizophrenia (HCC) [F20.3]   . Tobacco use disorder severe. [F17.200] 02/15/2015   Total Time spent with patient: 15 minutes  Past Psychiatric History: Schizophrenia.  Past Medical History:  Past Medical History  Diagnosis Date  . 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 Schizophrenia [facility])     Past Surgical History  Procedure Laterality Date  . Cochlear implant     Family History: History reviewed. No pertinent family history. Family Psychiatric  History: See H&P. Social History:  History  Alcohol Use No     History  Drug Use  . Yes  . Special: Marijuana    Social History   Social History  . Marital Status: Single    Spouse Name: N/A  . Number of Children: N/A  . Years of Education: N/A   Social History Main Topics  . Smoking status: Current Every Day Smoker --  0.25 packs/day    Types: Cigarettes  . Smokeless tobacco: None  . Alcohol Use: No  . Drug Use: Yes    Special: Marijuana  . Sexual Activity: No   Other Topics Concern  . None   Social History Narrative   Additional Social History:    History of alcohol / drug use?: Yes  Sleep: Fair  Appetite:  Good  Current Medications: Current Facility-Administered Medications  Medication Dose Route Frequency Provider Last Rate Last Dose  . acetaminophen (TYLENOL) tablet 650 mg  650 mg Oral Q6H PRN Audery Amel, MD      . alum & mag hydroxide-simeth (MAALOX/MYLANTA) 200-200-20 MG/5ML suspension 30 mL  30 mL Oral Q4H PRN Audery Amel, MD   30 mL at 01/16/16 1404  . ARIPiprazole (ABILIFY) tablet 30 mg  30 mg Oral Daily Gena Fray, MD   30 mg at 01/19/16 7829  . ARIPiprazole SUSR 400 mg  400 mg Intramuscular Q30 days Jimmy Footman, MD   400 mg at 01/10/16 1726  . magnesium hydroxide (MILK OF MAGNESIA) suspension 30 mL  30 mL Oral Daily PRN Audery Amel, MD      . nicotine (NICODERM CQ - dosed in mg/24 hours) patch 14 mg  14 mg Transdermal Daily Jimmy Footman, MD   14 mg at 01/19/16 5621  . traZODone (DESYREL) tablet 75 mg  75 mg Oral QHS PRN Johnella Moloney  Jenne CampusMcQueen, MD   75 mg at 01/18/16 2116    Lab Results: No results found for this or any previous visit (from the past 48 hour(s)).  Blood Alcohol level:  Lab Results  [facility] Value Date   [facility] 01[facility]   ETH <5 02/13/2015    Physical Findings: AIMS: Not assessed today.   CIWA:    COWS:     Musculoskeletal: Strength & Muscle Tone: within normal limits Gait & Station: normal Patient leans: N/A  Psychiatric Specialty Exam: Review of Systems  Psychiatric/Behavioral: Positive for hallucinations. Negative for depression, suicidal ideas, memory loss and substance abuse. The patient is not nervous/anxious and does not have insomnia.   All other systems reviewed and are negative.   Blood pressure 138/85,  pulse 82, temperature 98 F (36.7 C), temperature source Oral, resp. rate 20, height 5\' 4"  (1.626 m), weight 122 lb (55.339 kg), SpO2 99 %.Body mass index is 20.93 kg/(m^2).  General Appearance: Casual  Eye Contact::  Fair  Speech:  Minimal spontaneous speech   Volume:  Normal  Mood:  "I feel fine."   Affect:  Restricted   Thought Process:  Coherent, Goal Directed, Intact, Linear and Logical  Orientation:  Full (Time, Place, and Person)  Thought Content:  Delusional thinks that the cochlear implant he is sending waves  Suicidal Thoughts:  No  Homicidal Thoughts:  No  Memory:  Immediate;   Fair Recent;   Fair Remote;   Fair  Judgement:  Fair  Insight:  Fair  Psychomotor Activity:  Normal  Concentration:  Fair  Recall:  Fis[employer]Fair  Fund of Knowledge:Fair  Language: Fair  Akathisia:  No  Handed:  Right  AIMS (if indicated):     Assets:  Communication Skills Desire for Improvement Financial Resources/Insurance Housing Physical Health Resilience Social Support  ADL's:  Intact  Cognition: WNL  Sleep:  Number of Hours: 7.3   Treatment Plan Summary: Daily contact with patient to assess and evaluate symptoms and progress in treatment and Medication management   Justin Schaefer is a 25 year old male with a history of schizophrenia admitted for worsening of depression and suicide attempt by a gun and provoking suicide by police. Pt seems regress. Will continue with current treatment plan.   1. Suicidal ideation. The patient is able to contract for safety.  2. Mood and psychosis.   - Continue Abilify to 30mg  po Qam on 01-17-16.   - Continue Abilify Maintena injection for psychosis.   3. Insomnia: improved  - Continue trazodone to 75mg  po QHS due to vivid dreams.   4.Smoking. He is on the nicotine patch.   5. Substance abuse. The patient minimizes his problems and is not interested in substance abuse treatment at this point.   6. Precautions every 15 minute checks.  7.  Hospitalization status: voluntary   8. Diet regular  9. Discharge disposition patient will [place] to [place] where he lives with his brother. He may be moving in with his father.   - Discharge on April 17/18, 2017.   10. Discharge follow-up: Patient reports having Medicaid and receives disability. Patient will need to be connected with [facility] were he should be referred for community support team.   We ordered double portions 01/12/16.   Patrick NorthAVI, Draya Felker, MD 01/19/2016, 12:53 PM

## 2016-01-19 NOTE — Progress Notes (Signed)
D: Pt denies SI and AVH. Seen interacting appropriately with peers. Affect noted to be a bit brighter than yesterday evening. Guarded but pleasant.  A: Encouragement and support provided. Q15 minute checks maintained for safety. Medications given as prescribed.  R: Remains safe on unit. Voices no additional concerns. Will continue to monitor.

## 2016-01-19 NOTE — [facility] Group Notes (Signed)
[facility] LCSW Group Therapy  01/19/2016 10:55 AM  Type of Therapy:  Group Therapy  Participation Level:  Did Not Attend  Modes of Intervention:  Activity, Education, Exploration, Socialization and Support  Summary of Progress/Problems: Mindfulness: Patient discussed mindfulness and relaxing techniques and why they are beneficial. Pt discussed ways to incorporate mindfulness in their lives. Pt practiced a mindfulness techique and discussed how it made them feel.   Katherene Dinino L Jash Wahlen MSW, LCSWA  01/19/2016, 10:55 AM   

## 2016-01-19 NOTE — Progress Notes (Signed)
Noted to be pacing the hallways. Said that he was fine but appears preoccupied and endorsing +ve hallucinations though states that they have decreased. Encouraged to continue verbalizing feelings to nursing staff. Remains isolative.

## 2016-01-20 MED ORDER — BENZTROPINE MESYLATE 1 MG PO TABS
1.0000 mg | ORAL_TABLET | Freq: Every day | ORAL | Status: DC
  Administered 2016-01-20 – 2016-01-21 (×2): 1 mg via ORAL
  Filled 2016-01-20 (×2): qty 1

## 2016-01-20 MED ORDER — SELENIUM SULFIDE 1 % EX LOTN
TOPICAL_LOTION | Freq: Every day | CUTANEOUS | Status: DC
  Administered 2016-01-20 – 2016-01-21 (×2): via TOPICAL
  Filled 2016-01-20: qty 207

## 2016-01-20 NOTE — Progress Notes (Signed)
D:  Pt came up to staff and said he was "drooling" and feeling some mild stiffness in his jaw, per the patient this has been happening for about 4 days now and he had not told anyone yet, MD notified and placed new orders see order management, Per pt self inventory pt reports sleeping good, appetite good, energy level normal, ability to pay attention good, rates depression at a 0 out of 10, hopelessness at a 0 out of 10, anxiety at a 0 out of 10, denies SI/HI/AVH, goal today: "go home"     A:  Emotional support provided, Encouraged pt to continue with treatment plan and attend all group activities, q15 min checks maintained for safety.  R:  Pt is receptive but limited insight, going to some groups, pleasant and cooperative with staff and other patients on the unit.

## 2016-01-20 NOTE — Progress Notes (Signed)
Pt is withdrawn and appears to be responding to internal stimuli but remains calm and cooperative. Denies SI/HI, PRN Trazodone 75mg  given at HS for sleep. Will continue to monitor for safety

## 2016-01-20 NOTE — Progress Notes (Signed)
Recreation Therapy Notes  Date: 04.17.17 Time: 9:30 am Location: Craft Room  Group Topic: Self-expression  Goal Area(s) Addresses:  Patient will identify one color per emotion listed on the wheel. Patient will verbalize benefit of using art as a means of self-expression. Patient will verbalize one emotion experienced during group. Patient will be educated on other forms of self-expression.  Behavioral Response: Attentive, Interactive  Intervention: Emotion Wheel  Activity: Patients were given an Arboriculturistmotion Wheel worksheet and instructed to pick a color for each emotion listed on the emotion wheel.  Education: LRT educated patient on different forms of self-expression.  Education Outcome: Acknowledges education/In group clarification offered   Clinical Observations/Feedback: Patient completed activity by picking a color for each emotion. Patient contributed to group discussion by stating what colors he picked for each emotion, how it feels to describe his emotions in color, how he can use art as a means of self-expression, and what emotions he felt while he was coloring.  Jacquelynn CreeGreene,Cree Kunert M, LRT/CTRS 01/20/2016 10:17 AM

## 2016-01-20 NOTE — Progress Notes (Signed)
And noted to have difficulty speakinPatient ID: Justin Schaefer, male   DOB: 1991/07/21, 25 y.o.   MRN: [id number]  [facility] MD Progress Note  01/20/2016 11:56 AM Justin Schaefer  MRN:  [id number]  Subjective:   Patient complains of having drooling. He was noted to have some difficulties processing his mouth. He tells me he feels his tongue is a little swollen.  He denies any other problems or concerns. He denies suicidality, homicidality or having auditory or visual hallucinations. Per nursing staff patient has been reported to them having auditory hallucinations. He has been attending some groups. He has been compliant with medications. Today a family meeting is planned. Social worker is trying to set up the patient with either ACT or at least CST. Patient has been noncompliant with medications or with follow-up. Prior to admission he was attempting to kill himself with a gun and was firing the gun inside the house. He reported that the suicide was triggered by voices talking bad about him.  Now on Abilify oral and injectable.   Per nursing: D: Pt came up to staff and said he was "drooling" and feeling some mild stiffness in his jaw, per the patient this has been happening for about 4 days now and he had not told anyone yet, MD notified and placed new orders see order management, Per pt self inventory pt reports sleeping good, appetite good, energy level normal, ability to pay attention good, rates depression at a 0 out of 10, hopelessness at a 0 out of 10, anxiety at a 0 out of 10, denies SI/HI/AVH, goal today: "go home"    Principal Problem: Undifferentiated schizophrenia (HCC) Diagnosis:   Patient Active Problem List   Diagnosis Date Noted  . Cocaine use disorder, mild, abuse [F14.10] 01/10/2016  . Cannabis use disorder, moderate, dependence (HCC) [F12.20] 01/10/2016  . Undifferentiated schizophrenia (HCC) [F20.3]   . Tobacco use disorder severe. [F17.200] 02/15/2015   Total Time spent  with patient: 15 minutes  Past Psychiatric History: Schizophrenia.  Past Medical History:  Past Medical History  Diagnosis Date  . 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 Schizophrenia [facility])     Past Surgical History  Procedure Laterality Date  . Cochlear implant     Family History: History reviewed. No pertinent family history. Family Psychiatric  History: See H&P. Social History:  History  Alcohol Use No     History  Drug Use  . Yes  . Special: Marijuana    Social History   Social History  . Marital Status: Single    Spouse Name: N/A  . Number of Children: N/A  . Years of Education: N/A   Social History Main Topics  . Smoking status: Current Every Day Smoker -- 0.25 packs/day    Types: Cigarettes  . Smokeless tobacco: None  . Alcohol Use: No  . Drug Use: Yes    Special: Marijuana  . Sexual Activity: No   Other Topics Concern  . None   Social History Narrative   Additional Social History:    History of alcohol / drug use?: Yes  Sleep: Fair  Appetite:  Good  Current Medications: Current Facility-Administered Medications  Medication Dose Route Frequency Provider Last Rate Last Dose  . acetaminophen (TYLENOL) tablet 650 mg  650 mg Oral Q6H PRN Audery Amel, MD      . alum & mag hydroxide-simeth (MAALOX/MYLANTA) 200-200-20 MG/5ML suspension 30 mL  30 mL Oral Q4H PRN Audery Amel, MD  30 mL at 01/16/16 1404  . ARIPiprazole (ABILIFY) tablet 30 mg  30 mg Oral Daily Gena Frayyan G McQueen, MD   30 mg at 01/20/16 0850  . ARIPiprazole SUSR 400 mg  400 mg Intramuscular Q30 days Jimmy FootmanAndrea Hernandez-Gonzalez, MD   400 mg at 01/10/16 1726  . benztropine (COGENTIN) tablet 1 mg  1 mg Oral Daily Jimmy FootmanAndrea Hernandez-Gonzalez, MD   1 mg at 01/20/16 1150  . magnesium hydroxide (MILK OF MAGNESIA) suspension 30 mL  30 mL Oral Daily PRN Audery AmelJohn T Clapacs, MD      . nicotine (NICODERM CQ - dosed in mg/24 hours) patch 14 mg  14 mg Transdermal Daily Jimmy FootmanAndrea Hernandez-Gonzalez, MD   14 mg at  01/20/16 0850  . selenium sulfide (SELSUN) 1 % shampoo   Topical Daily Jimmy FootmanAndrea Hernandez-Gonzalez, MD      . traZODone (DESYREL) tablet 75 mg  75 mg Oral QHS PRN Gena Frayyan G McQueen, MD   75 mg at 01/19/16 2058    Lab Results: No results found for this or any previous visit (from the past 48 hour(s)).  Blood Alcohol level:  Lab Results  Component Value Date   [facility] 01/[facility]   ETH <5 02/13/2015    Physical Findings: AIMS: Not assessed today.   CIWA:    COWS:     Musculoskeletal: Strength & Muscle Tone: within normal limits Gait & Station: normal Patient leans: N/A  Psychiatric Specialty Exam: Review of Systems  Constitutional: Negative.   HENT: Negative.   Eyes: Negative.   Respiratory: Negative.   Gastrointestinal: Negative.        Complaints of sialorrhea  Genitourinary: Negative.   Musculoskeletal: Negative.   Skin: Negative.   Neurological: Negative.   Endo/Heme/Allergies: Negative.   Psychiatric/Behavioral: Negative.  Negative for depression, suicidal ideas, hallucinations, memory loss and substance abuse. The patient is not nervous/anxious and does not have insomnia.   All other systems reviewed and are negative.   Blood pressure 141/78, pulse 73, temperature 97.9 F (36.6 C), temperature source Oral, resp. rate 20, height 5\' 4"  (1.626 m), weight 55.339 kg (122 lb), SpO2 99 %.Body mass index is 20.93 kg/(m^2).  General Appearance: Casual  Eye Contact::  Fair  Speech:  Minimal spontaneous speech   Volume:  Normal  Mood:  "I feel fine."   Affect:  Restricted   Thought Process:  Coherent, Goal Directed, Intact, Linear and Logical  Orientation:  Full (Time, Place, and Person)  Thought Content:  Delusional thinks that the cochlear implant he is sending waves  Suicidal Thoughts:  No  Homicidal Thoughts:  No  Memory:  Immediate;   Fair Recent;   Fair Remote;   Fair  Judgement:  Fair  Insight:  Fair  Psychomotor Activity:  Normal  Concentration:  Fair  Recall:   FiservFair  Fund of Knowledge:Fair  Language: Fair  Akathisia:  No  Handed:  Right  AIMS (if indicated):     Assets:  Communication Skills Desire for Improvement Financial Resources/Insurance Housing Physical Health Resilience Social Support  ADL's:  Intact  Cognition: WNL  Sleep:  Number of Hours: 6   Treatment Plan Summary: Daily contact with patient to assess and evaluate symptoms and progress in treatment and Medication management   [name] is a 25 year old male with a history of schizophrenia admitted for worsening of depression and suicide attempt by a gun and provoking suicide by police. Pt seems regress. Will continue with current treatment plan.   1. Suicidal ideation. The patient is  able to contract for safety.  2. Mood and psychosis.   - Continue Abilify to  po Qam   - Continue Abilify Maintena injection for psychosis.---Given on April 7  EPS: Patient noted to have difficulty speaking, he reported having sialorrhea. Likely mild dystonic reaction. He will be is started on benztropine 1 mg daily.  3. Insomnia: improved  - Continue trazodone to  po QHS due to vivid dreams.   4.Smoking. He is on the nicotine patch.   5. Substance abuse. The patient minimizes his problems and is not interested in substance abuse treatment at this point.   6. Precautions every 15 minute checks.  7. Hospitalization status: voluntary   8. Diet regular  9. Discharge disposition patient will return to his home in [place], [place] where he lives with his brother. He may be moving in with his father.   - Discharge on April 17/18, 2017.   10. Discharge follow-up: Patient reports having Medicaid and receives disability. Patient will need to be connected with [facility] were he should be referred for community support team.      Jimmy Footman, MD 01/20/2016, 11:56 AM

## 2016-01-21 MED ORDER — BENZTROPINE MESYLATE 1 MG PO TABS: 1.0000 mg | ORAL_TABLET | Freq: Every day | ORAL | Status: DC

## 2016-01-21 MED ORDER — ARIPIPRAZOLE ER 400 MG IM SUSR: 400.0000 mg | INTRAMUSCULAR | Status: DC

## 2016-01-21 MED ORDER — ARIPIPRAZOLE 30 MG PO TABS: 30.0000 mg | ORAL_TABLET | Freq: Every day | ORAL | Status: DC

## 2016-01-21 NOTE — Discharge Summary (Signed)
Physician Discharge Summary Note  Patient:  Justin Schaefer is an 25 y.o., male MRN:  [id number] DOB:  [birth year] Patient phone:  [phone] (home)  Patient address:    [address] [place] [address],  Total Time spent with patient: 45 minutes  Date of Admission:  01/09/2016 Date of Discharge: 01/21/16  Reason for Admission:  Psychosis and suicidality  Principal Problem: Undifferentiated schizophrenia [facility]) Discharge Diagnoses: Patient Active Problem List   Diagnosis Date Noted  . Cocaine use disorder, mild, abuse [F14.10] 01/10/2016  . Cannabis use disorder, moderate, dependence ([place]) [F12.20] 01/10/2016  . Undifferentiated schizophrenia ([place]) [F20.3]   . Tobacco use disorder severe. [F17.200] 02/15/2015   History of Present Illness: Justin Schaefer is a 25 y.o. male with a history of schizophrenia who presented to the ER after he purchased a gun with intent of committing suicide; police were called by neighbors when patient fired his weapon 3 times. Per their IVC papers, when they arrived he raised his weapon at them and told them he did so in hopes that they would shoot him and kill him. He stated he had these suicidal thoughts for "a while".   Urine toxicology screen was positive for cocaine, cannabis. Alcohol level was below the detection limit.  Two prior hospitalizations in our unit for psychosis. His last discharge was in the summer of 2006. Then he was treated with Abilify 10 mg oral and receive Abilify injectable. Patient reported that he has not taking any medications or follow-up with psychiatry since then.   Patient reported to the ER psychiatrist that he fired the gun a couple times to be sure that he knew how it worked. He pointed at himself a couple times but couldn't actually bring himself to shoot himself. By that time police showed up because neighbors had heard the gunshots. Michela Pitcher he wanted to kill himself because people were bothering him. His description  of "people bothering him" was that he hears voices talking to him sometimes he can understand them and sometimes he can't. His thought process was described as disorganized while in the emergency department.  Today the patient tells me he is not depressed or suicidal. He tells me he is still hearing voices that tell him random things but he was not able to give me an example. He said the voices sometimes tell him good things. Patient tells me he smokes marijuana about once a week. He says that prior to admission he tried cocaine for the first time.   He denies having any current stressors. He denies problems in the relationship with his family. The patient has been receiving disability for about a year. 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  Patient says he is originally from [place]. He moved to [place] in his senior year of high school. He was living with his father and his father's girlfriend about about a year ago. For the last year he has been living with his younger brother who is 6 years old in [place].  Substance abuse history: Has had a past history of using cannabis but I believe this is the first time he's been identified as possibly also using cocaine. Patient minimizes it even when I ask him several times he denies that he's been using any drugs.  Gun has been removed by police.  Associated Signs/Symptoms: Depression Symptoms: suicidal attempt, (Hypo) Manic Symptoms:  Denies Anxiety Symptoms: Denies Psychotic Symptoms: Hallucinations: Auditory PTSD Symptoms: NA   Past Psychiatric History: two prior psychiatric hospitalization at our facility. Last one about a year ago. At that time he eventually responded to Abilify and was started on long-acting injectable medicine. There is a report that he had had a previous trial of Risperdal without benefit as well. No known prior suicide  attempts. Prior diagnosis of schizophrenia.  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 Not really employed. He says he did get disability and does have Medicaid. Stays in touch with his brother and his father is his closest relatives.  He denies any legal history.  As far as his education he completed high school. States that he repeated 12 grade. Denies needing a special education and states he was in regular classes. Denies any behavioral problems while in school. Past Medical History:  Past Medical History  Diagnosis Date  . 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 Schizophrenia [facility])     Past Surgical History  Procedure Laterality Date  . Cochlear implant      Social History:  History  Alcohol Use No     History  Drug Use  . Yes  . Special: Marijuana    Social History   Social History  . Marital Status: Single    Spouse Name: N/A  . Number of Children: N/A  . Years of Education: N/A   Social History Main Topics  . Smoking status: Current Every Day Smoker -- 0.25 packs/day    Types: Cigarettes  . Smokeless tobacco: None  . Alcohol Use: No  . Drug Use: Yes    Special: Marijuana  . Sexual Activity: No   Other Topics Concern  . None   Social History Narrative    Hospital Course:    Justin Schaefer is a 25 year old male with a history of schizophrenia admitted for worsening of depression and suicide attempt by a gun and provoking suicide by police. Pt seems regress. Will continue with current treatment plan.   Suicidal ideation. The  patient is able to contract for safety.  Schizophrenia - Continue Abilify to 51m po Qam  - Continue Abilify Maintena injection for psychosis.---Given on April 7  EPS: Patient noted to have difficulty speaking, he reported having sialorrhea. Likely mild dystonic reaction. He has been started on benztropine 1 mg daily.  Insomnia: improved - Continue trazodone to 762mpo QHS prn Smoking. He is on the nicotine patch.   Substance abuse. The patient minimizes his problem.   Hospitalization status: voluntary    Discharge [place] He may be moving in with his father.  - Discharge on April 17/18, 2017.   Discharge follow-up: Patient reports having Medicaid and receives disability.   Family meeting was held on April 17. His father reports that the patient has been using marijuana and crack cocaine. He father feels that the drugs have a lot to do with the CPAP of his  illness and with the hospitalizations. The father said that he took him to [facility] for follow-up but eventually the patient refused to go in.  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 Patient manage his own money.   Social worker has made a referral for ACT services  with [employer].  Social work also will give the patient's father information for payee services. Patient has agreed that payee services would be beneficial to him.    On discharge the patient reported significant improvement. He denied side effects from his medications. Denies physical complaints. He denied depression, problems with mood, appetite, energy, sleep or concentration. Denied suicidality, homicidality, hopelessness or helplessness. All guns had been removed from the home.  There was no need for seclusion, restraints or forced  medications.Throughout his hospitalization he was calm and cooperative. At no time he displayed aggression or agitation.  Physical Findings: AIMS:  , ,  ,  , Dental Status Current problems with teeth and/or dentures?: No Does patient usually wear dentures?: No  CIWA:    COWS:     Musculoskeletal: Strength & Muscle Tone: within normal limits Gait & Station: normal Patient leans: N/A  Psychiatric Specialty Exam: Review of Systems  Constitutional: Negative.   HENT: Negative.   Eyes: Negative.   Respiratory: Negative.   Cardiovascular: Negative.   Gastrointestinal: Negative.   Genitourinary: Negative.   Musculoskeletal: Negative.   Skin: Negative.   Neurological: Negative.   Endo/Heme/Allergies: Negative.   Psychiatric/Behavioral: Negative.     Blood pressure 133/81, pulse 106, temperature 97.9 F (36.6 C), temperature source Oral, resp. rate 20, height 5' 4" (1.626 m), weight 55.339 kg (122 lb), SpO2 99 %.Body mass index is 20.93 kg/(m^2).  General Appearance: Fairly Groomed  Engineer, water::  Good  Speech:  Clear and Coherent  Volume:  Normal  Mood:  Euthymic  Affect:  Constricted  Thought Process:  concrete  Orientation:  Full (Time, Place, and Person)  Thought Content:  Hallucinations: None  Suicidal Thoughts:  No  Homicidal Thoughts:  No  Memory:  Immediate;   Good Recent;   Good Remote;   Good  Judgement:  Poor  Insight:  Shallow  Psychomotor Activity:  Normal  Concentration:  Fair  Recall:  Lane of Knowledge:Fair  Language: Good  Akathisia:  No  Handed:    AIMS (if indicated):     Assets:  Communication Skills Physical Health  ADL's:  Intact  Cognition: WNL  Sleep:  Number of Hours: 6   Have you used any form of tobacco in the last 30 days? (Cigarettes, Smokeless Tobacco, Cigars, and/or Pipes): Yes  Has this patient used any form of tobacco in the last 30 days? (Cigarettes, Smokeless Tobacco, Cigars, and/or Pipes) Yes, Yes, A prescription for an  FDA-approved tobacco cessation medication was offered at discharge and the patient refused  Blood Alcohol level:  Lab Results  Component Value Date   [facility] <5 01/08/2016   ETH <5 85/63/1497    Metabolic Disorder Labs:  Lab Results  Component Value Date   HGBA1C 5.2 01/09/2016   Lab Results  Component Value Date   PROLACTIN 4.4 01/09/2016   Lab Results  Component Value Date   CHOL 184 01/09/2016   TRIG 136 01/09/2016   HDL 40* 01/09/2016   CHOLHDL 4.6 01/09/2016   VLDL 27 01/09/2016   LDLCALC 117* 01/09/2016   LDLCALC 104* 02/18/2015   Results  for ANEES, VANECEK (MRN [id number]) as of 01/21/2016 07:11  Ref. Range 01/08/2016 16:06 01/08/2016 23:39 01/09/2016 17:03  Sodium Latest Ref Range: 135-145 mmol/L 137    Potassium Latest Ref Range: 3.5-5.1 mmol/L 4.3    Chloride Latest Ref Range: 101-111 mmol/L 104    CO2 Latest Ref Range: 22-32 mmol/L 24    BUN Latest Ref Range: 6-20 mg/dL 11    Creatinine Latest Ref Range: 0.61-1.24 mg/dL 1.03    Calcium Latest Ref Range: 8.9-10.3 mg/dL 9.1    EGFR (Non-African Amer.) Latest Ref Range: >60 mL/min >60    EGFR (African American) Latest Ref Range: >60 mL/min >60    Glucose Latest Ref Range: 65-99 mg/dL 107 (H)    Anion gap Latest Ref Range: 5-15  9    Alkaline Phosphatase Latest Ref Range: 38-126 U/L 63    Albumin Latest Ref Range: 3.5-5.0 g/dL 4.5    AST Latest Ref Range: 15-41 U/L 25    ALT Latest Ref Range: 17-63 U/L 17    Total Protein Latest Ref Range: 6.5-8.1 g/dL 7.6    Total Bilirubin Latest Ref Range: 0.3-1.2 mg/dL 0.8    Cholesterol Latest Ref Range: 0-200 mg/dL   184  Triglycerides Latest Ref Range: <150 mg/dL   136  HDL Cholesterol Latest Ref Range: >40 mg/dL   40 (L)  LDL (calc) Latest Ref Range: 0-99 mg/dL   117 (H)  VLDL Latest Ref Range: 0-40 mg/dL   27  Total CHOL/HDL Ratio Latest Units: RATIO   4.6  WBC Latest Ref Range: 3.8-10.6 K/uL 9.1    RBC Latest Ref Range: 4.40-5.90 MIL/uL 5.65    Hemoglobin Latest Ref  Range: 13.0-18.0 g/dL 16.5    HCT Latest Ref Range: 40.0-52.0 % 48.9    MCV Latest Ref Range: 80.0-100.0 fL 86.6    MCH Latest Ref Range: 26.0-34.0 pg 29.2    MCHC Latest Ref Range: 32.0-36.0 g/dL 33.7    RDW Latest Ref Range: 11.5-14.5 % 12.9    Platelets Latest Ref Range: 150-440 K/uL 166    Acetaminophen (Tylenol), S Latest Ref Range: 10-30 ug/mL <87 (L)    Salicylate Lvl Latest Ref Range: 2.8-30.0 mg/dL <4.0    Prolactin Latest Ref Range: 4.0-15.2 ng/mL   4.4  Hemoglobin A1C Latest Ref Range: 4.0-6.0 %   5.2  TSH Latest Ref Range: 0.350-4.500 uIU/mL   1.689  Alcohol, Ethyl (B) Latest Ref Range: <5 mg/dL <5    Amphetamines, Ur Screen Latest Ref Range: NONE DETECTED   NONE DETECTED   Barbiturates, Ur Screen Latest Ref Range: NONE DETECTED   NONE DETECTED   Benzodiazepine, Ur Scrn Latest Ref Range: NONE DETECTED   NONE DETECTED   Cocaine Metabolite,Ur [place] Latest Ref Range: NONE DETECTED   POSITIVE (A)   Methadone Scn, Ur Latest Ref Range: NONE DETECTED   NONE DETECTED   MDMA (Ecstasy)Ur Screen Latest Ref Range: NONE DETECTED   NONE DETECTED   Cannabinoid 50 Ng, Ur [place] Latest Ref Range: NONE DETECTED   POSITIVE (A)   Opiate, Ur Screen Latest Ref Range: NONE DETECTED   NONE DETECTED   Phencyclidine (PCP) Ur S Latest Ref Range: NONE DETECTED   NONE DETECTED   Tricyclic, Ur Screen Latest Ref Range: NONE DETECTED   NONE DETECTED    See Psychiatric Specialty Exam and Suicide Risk Assessment completed by Attending Physician prior to discharge.  Discharge destination:  Home  Is patient on multiple antipsychotic therapies at discharge:  Yes,   Do you  recommend tapering to monotherapy for antipsychotics?  Yes   Has Patient had three or more failed trials of antipsychotic monotherapy by history:  No  Recommended Plan for Multiple Antipsychotic Therapies: Taper to monotherapy as described:  considering tapering off oral abilify in the next 30 days     Medication List    TAKE these  medications      Indication   ARIPiprazole 30 MG tablet  Commonly known as:  ABILIFY  Take 1 tablet (30 mg total) by mouth daily.  Notes to Patient:  schizophrenia      ARIPiprazole 400 MG Susr  Inject 400 mg into the muscle every 30 (thirty) days.  Start taking on:  02/07/2016  Notes to Patient:  schizophrenia   Indication:  Schizophrenia     benztropine 1 MG tablet  Commonly known as:  COGENTIN  Take 1 tablet (1 mg total) by mouth daily.  Notes to Patient:  Side effects from abilify        Follow-up Information    Schedule an appointment as soon as possible for a visit with [facility].   Why:  Please call [facility] at discharge at ph: [phone] to schedule your hospital follow up appointment for substance abuse treatment, medication management and therapy   Contact information:   [address], [place] [address] Ph: [phone] Fax: [phone]     >30 minutes. >50 % of the time was spent in coordination of care  Signed: Hildred Priest, MD 01/21/2016, 3:19 PM

## 2016-01-21 NOTE — [facility] Group Notes (Signed)
[facility] Group Notes:  (Nursing/MHT/Case Management/Adjunct)  Date:  01/21/2016  Time:  2:21 PM  Type of Therapy:  Psychoeducational Skills  Participation Level:  Active  Participation Quality:  Appropriate, Attentive and Sharing  Affect:  Appropriate  Cognitive:  Alert and Appropriate  Insight:  Appropriate  Engagement in Group:  Engaged  Modes of Intervention:  Discussion, Education and Support  Summary of Progress/Problems:  Lynelle SmokeCara Travis Shadawn Hanaway 01/21/2016, 2:21 PM

## 2016-01-21 NOTE — Progress Notes (Signed)
D: Patient denies SI/HI/AVH.  Patient affect and mood are anxious  Patient did not attend evening group. Patient visible on the milieu. No distress noted. A: Support and encouragement offered. Scheduled medications given to pt. Q 15 min checks continued for patient safety. R: Patient receptive. Patient remains safe on the unit.

## 2016-01-21 NOTE — [facility] Group Notes (Signed)
[facility] LCSW Group Therapy  01/21/2016 4:46 PM  Type of Therapy:  Group Therapy  Participation Level:  Minimal  Participation Quality:  Appropriate  Affect:  Flat  Cognitive:  Appropriate  Insight:  Limited   Engagement in Therapy:  Limited  Modes of Intervention:  Activity, Discussion, Socialization and Support  Summary of Progress/Problems:   Justin Schaefer, Justin Schaefer Pt participated and attended group and participated in discussion around Locus of control and how depending on beliefs about this different ways it could really influence how we view the obstacles we encounter 01/21/2016, 4:46 PM, MSW, LCSW

## 2016-01-21 NOTE — Tx Team (Signed)
Interdisciplinary Treatment Plan Update (Adult)        Date: 01/21/2016   Time Reviewed: 9:30 AM   Progress in Treatment: Improving  Attending groups: Yes  Participating in groups: Yes Taking medication as prescribed: Yes  Tolerating medication: Yes  Family/Significant other contact made: Yes, CSW spoke to the pt's brother  Patient understands diagnosis: Yes  Discussing patient identified problems/goals with staff: Yes  Medical problems stabilized or resolved: Yes  Denies suicidal/homicidal ideation: Yes  Issues/concerns per patient self-inventory: Yes  Other:   New problem(s) identified: N/A   Discharge: Pt will discharge home to [facility] to live with his family and follow up with the [facility] for medication management and therapy   Reason for Continuation of Hospitalization:   Depression   Anxiety   Medication Stabilization   Comments: N/A   Estimated date of discharge: 01/21/16   Patient is a 25 year old male admitted under IVC for firing a handgun. Patient lives in a 25 y.o. male with a history of schizophrenia who presented to the ER after he purchased a gun with intent of committing suicide; police were called by neighbors when patient fired his weapon 3 times. Per their IVC papers, when they arrived he raised his weapon at them and told them he did so in hopes that they would shoot him and kill him. He stated he had these suicidal thoughts for "a while".  Urine toxicology screen was positive for cocaine, cannabis. Alcohol level was below the detection limit. Two prior hospitalizations in our unit for psychosis. His last discharge was in the summer of 2006. Then he was treated with Abilify 10 mg oral and receive Abilify injectable. Patient reported that he has not taking any medications or follow-up with psychiatry since then.  Patient reported to the ER psychiatrist that he fired the gun a couple times to be sure that he knew how it worked. He pointed at  himself a couple times but couldn't actually bring himself to shoot himself. By that time police showed up because neighbors had heard the gunshots. Michela Pitcher he wanted to kill himself because people were bothering him. His description of "people bothering him" was that he hears voices talking to him sometimes he can understand them and sometimes he can't. His thought process was described as disorganized while in the emergency department.    Today the patient tells me he is not depressed or suicidal. He tells me he is still hearing voices that tell him random things but he was not able to give me an example. He said the voices sometimes tell him good things. Patient tells me he smokes marijuana about once a week. He says that prior to admission he tried cocaine for the first time.   He denies having any current stressors. He denies problems in the relationship with his family. The patient has been receiving disability for about a year. 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  Patient says he is originally from [place]. He moved to [place] in his senior year of high school. He was living with his father and his father's girlfriend about about a year ago.  For the last year he has been living with his younger brother who is 52 years old in [place].  Substance abuse history: Has had a past history of using cannabis but I believe this is the first time  he's been identified as possibly also using cocaine. Patient minimizes it even when I ask him several times he denies that he's been using any drugs.  Gun has been removed by police. Patient will benefit from crisis stabilization, medication evaluation, group therapy, and psycho education in addition to case management for discharge planning. Patient and CSW reviewed pt's identified goals and treatment plan. Pt verbalized understanding and agreed to treatment plan.     Review  of initial/current patient goals per problem list:  1. Goal(s): Patient will participate in aftercare plan   Met: Yes  Target date: 3-5 days post admission date   As evidenced by: Patient will participate within aftercare plan AEB aftercare provider and housing plan at discharge being identified.   4/7: No, CSW assessing for appropriate contacts  4/12: Pt will discharge home to [facility] to live with his family and follow up with the [facility] for medication management and therapy       2. Goal (s): Patient will exhibit decreased depressive symptoms and suicidal ideations.   Met: Adequate for discharge per MD.  Target date: 3-5 days post admission date   As evidenced by: Patient will utilize self-rating of depression at 3 or below and demonstrate decreased signs of depression or be deemed stable for discharge by MD.   4/7: Goal progressing.  4/12: Goal progressing.  Pt denies SI/HI.    4/14: Goal progressing.  Pt denies SI/HI.    4/18: Adequate for discharge per MD. Pt denies SI/HI.     3. Goal(s): Patient will demonstrate decreased signs and symptoms of anxiety.   Met: Adequate for discharge per MD.  Target date: 3-5 days post admission date   As evidenced by: Patient will utilize self-rating of anxiety at 3 or below and demonstrated decreased signs of anxiety, or be deemed stable for discharge by MD   4/7: Goal progressing.  4/12: Goal progressing.  4/14: Goal progressing.  4/18: Adequate for discharge per MD.  Pt reports baseline symptoms of anxiety     4. Goal(s): Patient will demonstrate decreased signs of withdrawal due to substance abuse   Met: Yes  Target date: 3-5 days post admission date   As evidenced by: Patient will produce a CIWA/COWS score of 0, have stable vitals signs, and no symptoms of withdrawal   4/7: Patient produced a CIWA/COWS score of 0, has stable vitals signs, and no symptoms of withdrawal     5. Goal(s): Patient will  demonstrate decreased signs of psychosis  * Met: Adequate for discharge per MD. * Target date: 3-5 days post admission date  * As evidenced by: Patient will demonstrate decreased frequency of AVH or return to baseline function  4/7: Goal progressing.  4/12: Goal progressing.   4/14: Goal progressing.  4/18: Adequate for discharge per MD.   Attendees:  Patient: Family:  Physician: Dr. Jerilee Hoh, MD 01/21/2016 9:30 AM  Nursing: Polly Cobia, RN 01/21/2016 9:30 AM  Clinical Social Worker: Marylou Flesher, [place] 01/21/2016 9:30 AM  Clinical Social Worker: Latanya Presser 01/21/2016 9:30 AM  Nursing:01/21/2016 9:30 AM  Nursing: Geanie Berlin, RN4/18/2017 9:30 AM  Other: 01/21/2016 9:30 AM   Alphonse Guild. Ulas Zuercher, LCSWA, LCAS 01/20/16

## 2016-01-21 NOTE — Progress Notes (Addendum)
  [facility] :  Will you be returning to the same living situation after discharge:  Yes,  pt will be returning home to live with his father At discharge, do you have transportation home?: Yes,  pt will be picked up by his father Do you have the ability to pay for your medications: Yes,  pt will be provided with prescriptions at discharge   Release of information consent forms completed and in the chart;  Patient's signature needed at discharge.  Patient to Follow up at: Follow-up Information    Schedule an appointment as soon as possible for a visit with [facility].   Why:  Please call Liborio NixonJanice at discharge at ph: [phone] if you have questions and arrive at the office on [address] for your appointment on Thursday April 20th at 10am for substance abuse treatment, medication management and therapy   Contact information:   [address] [place], [place] [address] Ph: [phone] Fax: [phone]      Next level of care provider has access to [facility] Link:no  Safety Planning and Suicide Prevention discussed: Yes,  completed with pt  Have you used any form of tobacco in the last 30 days? (Cigarettes, Smokeless Tobacco, Cigars, and/or Pipes): Yes  Has patient been referred to the Quitline?: Patient refused referral  Patient has been referred for addiction treatment: Yes  Dorothe PeaJonathan F Tyrann Donaho 01/21/2016, 1:25 PM

## 2016-01-21 NOTE — Plan of Care (Signed)
Problem: Ineffective individual coping Goal: STG: Patient will remain free from self harm Outcome: Progressing Patient endorses +ve AH but states they have decreased. Denies SI and contracts for safety.

## 2016-01-21 NOTE — [facility] Suicide Risk Assessment (Signed)
[facility] INPATIENT:  Family/Significant Other Suicide Prevention Education  Suicide Prevention Education:  Patient Refusal for Family/Significant Other Suicide Prevention Education: The patient Justin Schaefer has refused to provide written consent for family/significant other to be provided Family/Significant Other Suicide Prevention Education during admission and/or prior to discharge.  Physician notified.  Completed with pt.  Justin Schaefer 01/21/2016, 12:41 PM

## 2016-01-21 NOTE — Progress Notes (Signed)
Patient's discharge teaching, instructions and medications discussed with the patient. He verbalized understanding. Patient's belongings given back without any concerns being voiced. Patient escorted to the lobby where his father was waiting for him.

## 2016-01-21 NOTE — [facility] Suicide Risk Assessment (Signed)
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 Cocaine use disorder, mild, abuse [F14.10] 01/10/2016  . Cannabis use disorder, moderate, dependence (HCC) [F12.20] 01/10/2016  . Undifferentiated schizophrenia (HCC) [F20.3]   . Tobacco use disorder severe. [F17.200] 02/15/2015      Psychiatric Specialty Exam: ROS  Blood pressure 133/81, pulse 106, temperature 97.9 F (36.6 C), temperature source Oral, resp. rate 20, height 5\' 4"  (1.626 m), weight 55.339 kg (122 lb), SpO2 99 %.Body mass index is 20.93 kg/(m^2).                                                       Mental Status Per Nursing Assessment::   On Admission:     Demographic Factors:  Male  Loss Factors: Decrease in vocational status  Historical Factors: Impulsivity  Risk Reduction Factors:   Sense of responsibility to family, Living with another person, especially a relative and Positive social support  Continued Clinical Symptoms:  Alcohol/Substance Abuse/Dependencies Schizophrenia:   Less than 23 years old Previous Psychiatric Diagnoses and Treatments  Cognitive Features That Contribute To Risk:  None    Suicide Risk:  Minimal: No identifiable suicidal ideation.  Patients presenting with no risk factors but with morbid ruminations; may be classified as minimal risk based on the severity of the depressive symptoms    Jimmy FootmanHernandez-Gonzalez,  Sharrell Krawiec, MD 01/21/2016, 10:01 AM

## 2016-01-21 NOTE — Progress Notes (Signed)
Recreation Therapy Notes  Date: 04.18.17 Time: 9:30 am Location: Craft Room  Group Topic: Goal Setting  Goal Area(s) Addresses:  Patient will write one goal. Patient will write one positive statement.  Behavioral Response: Did not attend  Intervention: Step By Step  Activity: Patients were given a blank foot worksheet and instructed to write a goal on the inside of the foot and to write positive words on the outside of the foot.  Education: LRT educated patients on why it is important to set goals.  Education Outcome: Patient did not attend group.  Clinical Observations/Feedback: Patient did not attend group.  Jacquelynn CreeGreene,Minnie Shi M, LRT/CTRS 01/21/2016 10:12 AM

## 2019-03-07 ENCOUNTER — Emergency Department
Admission: EM | Admit: 2019-03-07 | Discharge: 2019-03-07 | Disposition: A | Discharge status: Home / Self Care | Payer: Medicare Other | Attending: Emergency Medicine | Admitting: Emergency Medicine

## 2019-03-07 ENCOUNTER — Other Ambulatory Visit: Payer: Self-pay

## 2019-03-07 ENCOUNTER — Encounter: Payer: Self-pay | Admitting: Emergency Medicine

## 2019-03-07 DIAGNOSIS — F17210 Nicotine dependence, cigarettes, uncomplicated: Secondary | ICD-10-CM | POA: Insufficient documentation

## 2019-03-07 DIAGNOSIS — R0989 Other specified symptoms and signs involving the circulatory and respiratory systems: Secondary | ICD-10-CM | POA: Diagnosis present

## 2019-03-07 DIAGNOSIS — J358 Other chronic diseases of tonsils and adenoids: Secondary | ICD-10-CM | POA: Diagnosis not present

## 2019-03-07 DIAGNOSIS — Z79899 Other long term (current) drug therapy: Secondary | ICD-10-CM | POA: Diagnosis not present

## 2019-03-07 MED ORDER — MAGIC MOUTHWASH W/LIDOCAINE
5.0000 mL | Freq: Four times a day (QID) | ORAL | 0 refills | Status: DC
Start: 2019-03-07 — End: 2019-09-13

## 2019-03-07 NOTE — ED Triage Notes (Signed)
Patient ambulatory to triage with steady gait, without difficulty or distress noted; pt reports sensation of something in back of throat; denies any pain or recent illness

## 2019-03-07 NOTE — ED Provider Notes (Signed)
[facility] Emergency Department Provider Note  ____________________________________________  Time seen: Approximately 8:35 PM  I have reviewed the triage vital signs and the nursing notes.   HISTORY  Chief Complaint Foreign Body    HPI Justin Schaefer is a 28 y.o. male who presents the emergency department complaining of foreign body to bilateral tonsils.  Patient reports that he identified to "white" foreign bodies to the tonsil.  Patient reports that he tried to touch them with his finger but given his gag reflex he was unable to do so.  He denies any sore throat, nasal congestion, cough, fevers or chills.         Past Medical History:  Diagnosis Date  . 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 Schizophrenia [facility](HCC)     Patient Active Problem List   Diagnosis Date Noted  . Cocaine use disorder, mild, abuse (HCC) 01/10/2016  . Cannabis use disorder, moderate, dependence (HCC) 01/10/2016  . Undifferentiated schizophrenia (HCC)   . Tobacco use disorder severe. 02/15/2015    Past Surgical History:  Procedure Laterality Date  . COCHLEAR IMPLANT      Prior to Admission medications   Medication Sig Start Date End Date Taking? Authorizing Provider  ARIPiprazole (ABILIFY) 30 MG tablet Take 1 tablet (30 mg total) by mouth daily. 01/21/16   Jimmy FootmanHernandez-Gonzalez, Andrea, MD  ARIPiprazole 400 MG SUSR Inject 400 mg into the muscle every 30 (thirty) days. 02/07/16   Jimmy FootmanHernandez-Gonzalez, Andrea, MD  benztropine (COGENTIN) 1 MG tablet Take 1 tablet (1 mg total) by mouth daily. 01/21/16   Jimmy FootmanHernandez-Gonzalez, Andrea, MD  magic mouthwash w/lidocaine SOLN Take 5 mLs by mouth 4 (four) times daily. 03/07/19   Kayron Kalmar, Delorise RoyalsJonathan D, PA-C    Allergies Patient has no known allergies.  No family history on file.  Social History Social History   Tobacco Use  . Smoking status: Current Every Day Smoker    Packs/day: 0.25    Types: Cigarettes  . Smokeless tobacco: Never  Used  Substance Use Topics  . Alcohol use: No  . Drug use: Yes    Types: Marijuana     Review of Systems  Constitutional: No fever/chills Eyes: No visual changes. No discharge ENT: Positive for foreign body to bilateral tonsils. Cardiovascular: no chest pain. Respiratory: no cough. No SOB. Gastrointestinal: No abdominal pain.  No nausea, no vomiting.  No diarrhea.  No constipation. Musculoskeletal: Negative for musculoskeletal pain. Skin: Negative for rash, abrasions, lacerations, ecchymosis. Neurological: Negative for headaches, focal weakness or numbness. 10-point ROS otherwise negative.  ____________________________________________   PHYSICAL EXAM:  VITAL SIGNS: ED Triage Vitals  Enc Vitals Group     BP 03/07/19 1952 (!) 148/88     Pulse Rate 03/07/19 1952 86     Resp 03/07/19 1952 19     Temp 03/07/19 1952 98.6 F (37 C)     Temp Source 03/07/19 1952 Oral     SpO2 03/07/19 1952 97 %     Weight 03/07/19 1950 160 lb (72.6 kg)     Height 03/07/19 1950 5\' 6"  (1.676 m)     Head Circumference --      Peak Flow --      Pain Score 03/07/19 1952 0     Pain Loc --      Pain Edu? --      Excl. in GC? --      Constitutional: Alert and oriented. Well appearing and in no acute distress. Eyes: Conjunctivae are normal. PERRL.  EOMI. Head: Atraumatic. ENT:      Ears:       Nose: No congestion/rhinnorhea.      Mouth/Throat: Mucous membranes are moist.  Visualization of bilateral tonsils reveals tonsillar stone to the right tonsil.  It appears that there may be a small tonsillar stone to the left as well.  Tonsils are neither erythematous nor edematous.  Use midline.  No other intraoral findings. Neck: No stridor.    Cardiovascular: Normal rate, regular rhythm. Normal S1 and S2.  Good peripheral circulation. Respiratory: Normal respiratory effort without tachypnea or retractions. Lungs CTAB. Good air entry to the bases with no decreased or absent breath  sounds. Musculoskeletal: Full range of motion to all extremities. No gross deformities appreciated. Neurologic:  Normal speech and language. No gross focal neurologic deficits are appreciated.  Skin:  Skin is warm, dry and intact. No rash noted. Psychiatric: Mood and affect are normal. Speech and behavior are normal. Patient exhibits appropriate insight and judgement.   ____________________________________________   LABS (all labs ordered are listed, but only abnormal results are displayed)  Labs Reviewed - No data to display ____________________________________________  EKG   ____________________________________________  RADIOLOGY   No results found.  ____________________________________________    PROCEDURES  Procedure(s) performed:    Procedures    Medications - No data to display   ____________________________________________   INITIAL IMPRESSION / ASSESSMENT AND PLAN / ED COURSE  Pertinent labs & imaging results that were available during my care of the patient were reviewed by me and considered in my medical decision making (see chart for details).  Review of the [place] CSRS was performed in accordance of the [facility] prior to dispensing any controlled drugs.           Patient's diagnosis is consistent with tonsillitis.  Patient presented to the emergency department with complaint of foreign body to the tonsils.  On exam, patient does have a tonsillar stone to the right.  It appears that there may be a small tonsillar stone on the left as well.  No indication of infectious process.  No labs or imaging.  Patient will be given Magic mouthwash for symptom relief.  Follow-up with ENT if needed. Patient is given ED precautions to return to the ED for any worsening or new symptoms.     ____________________________________________  FINAL CLINICAL IMPRESSION(S) / ED DIAGNOSES  Final diagnoses:  Tonsillith      NEW MEDICATIONS STARTED DURING THIS  VISIT:  ED Discharge Orders         Ordered    magic mouthwash w/lidocaine SOLN  4 times daily    Note to Pharmacy:  Dispense in a 1/1/1 ratio. Use lidocaine, diphenhydramine, prednisolone   03/07/19 2041              This chart was dictated using voice recognition software/Dragon. Despite best efforts to proofread, errors can occur which can change the meaning. Any change was purely unintentional.    Racheal Patches, PA-C 03/07/19 2042    Minna Antis, MD 03/07/19 2242

## 2019-09-13 ENCOUNTER — Other Ambulatory Visit: Payer: Self-pay

## 2019-09-13 ENCOUNTER — Encounter: Payer: Self-pay | Admitting: Emergency Medicine

## 2019-09-13 ENCOUNTER — Ambulatory Visit
Admission: EM | Admit: 2019-09-13 | Discharge: 2019-09-13 | Disposition: A | Discharge status: Home / Self Care | Payer: Medicare Other | Attending: Family Medicine | Admitting: Family Medicine

## 2019-09-13 DIAGNOSIS — J029 Acute pharyngitis, unspecified: Secondary | ICD-10-CM | POA: Insufficient documentation

## 2019-09-13 DIAGNOSIS — Z20828 Contact with and (suspected) exposure to other viral communicable diseases: Secondary | ICD-10-CM | POA: Insufficient documentation

## 2019-09-13 DIAGNOSIS — F1421 Cocaine dependence, in remission: Secondary | ICD-10-CM | POA: Insufficient documentation

## 2019-09-13 DIAGNOSIS — F209 Schizophrenia, unspecified: Secondary | ICD-10-CM | POA: Insufficient documentation

## 2019-09-13 DIAGNOSIS — Z79899 Other long term (current) drug therapy: Secondary | ICD-10-CM | POA: Diagnosis not present

## 2019-09-13 DIAGNOSIS — F17210 Nicotine dependence, cigarettes, uncomplicated: Secondary | ICD-10-CM | POA: Insufficient documentation

## 2019-09-13 DIAGNOSIS — F1221 Cannabis dependence, in remission: Secondary | ICD-10-CM | POA: Insufficient documentation

## 2019-09-13 LAB — RAPID STREP SCREEN (MED CTR MEBANE ONLY): Streptococcus, Group A Screen (Direct): NEGATIVE

## 2019-09-13 MED ORDER — LIDOCAINE VISCOUS HCL 2 % MT SOLN: OROMUCOSAL | 0 refills | Status: DC

## 2019-09-13 MED ORDER — IBUPROFEN 800 MG PO TABS: 800.0000 mg | ORAL_TABLET | Freq: Three times a day (TID) | ORAL | 0 refills | Status: DC | PRN

## 2019-09-13 NOTE — ED Triage Notes (Signed)
Patient c/o sore throat that started 1 week ago. Denies fever.

## 2019-09-13 NOTE — Discharge Instructions (Signed)
Strep negative.  Stay home. Awaiting COVID test results.  Medications as directed.  Take care  Dr. Lacinda Axon

## 2019-09-14 LAB — NOVEL CORONAVIRUS, NAA (HOSP ORDER, SEND-OUT TO REF LAB; TAT 18-24 HRS): SARS-CoV-2, NAA: NOT DETECTED

## 2019-09-14 NOTE — ED Provider Notes (Signed)
[facility]    CSN: [id number] Arrival date & time: 09/13/19  1526      History   Chief Complaint Chief Complaint  Patient presents with  . Sore Throat   HPI  28 year old male presents with sore throat.  Patient reports a 1 week history of sore throat.  Denies fever.  Denies any other respiratory symptoms.  No reported sick contacts.  No known exposures to COVID-19.  No medications or interventions tried.  No known exacerbating or relieving factors.  He rates his pain as 8/10 in severity.  No other associated symptoms.  No other complaints.  History reviewed and updated as below.  Past Medical History:  Diagnosis Date  . 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 Schizophrenia [facility])     Patient Active Problem List   Diagnosis Date Noted  . Cocaine use disorder, mild, abuse (HCC) 01/10/2016  . Cannabis use disorder, moderate, dependence (HCC) 01/10/2016  . Undifferentiated schizophrenia (HCC)   . Tobacco use disorder severe. 02/15/2015    Past Surgical History:  Procedure Laterality Date  . COCHLEAR IMPLANT     Home Medications    Prior to Admission medications   Medication Sig Start Date End Date Taking? Authorizing Provider  ARIPiprazole (ABILIFY) 30 MG tablet Take 1 tablet (30 mg total) by mouth daily. 01/21/16  Yes Jimmy Footman, MD  ibuprofen (ADVIL) 800 MG tablet Take 1 tablet (800 mg total) by mouth every 8 (eight) hours as needed for moderate pain. 09/13/19   Tommie Sams, DO  lidocaine (XYLOCAINE) 2 % solution Gargle 15 mL every 3 hours as needed. May swallow if desired. 09/13/19   Tommie Sams, DO  benztropine (COGENTIN) 1 MG tablet Take 1 tablet (1 mg total) by mouth daily. 01/21/16 09/13/19  Jimmy Footman, MD   Social History Social History   Tobacco Use  . Smoking status: Current Every Day Smoker    Packs/day: 0.25    Types: Cigarettes  . Smokeless tobacco: Never Used  Substance Use Topics  . Alcohol use: No  .  Drug use: Yes    Types: Marijuana     Allergies   Patient has no known allergies.   Review of Systems Review of Systems  Constitutional: Negative for fever.  HENT: Positive for sore throat.    Physical Exam Triage Vital Signs ED Triage Vitals  Enc Vitals Group     BP 09/13/19 1552 127/73     Pulse Rate 09/13/19 1552 69     Resp 09/13/19 1552 18     Temp 09/13/19 1552 98 F (36.7 C)     Temp Source 09/13/19 1552 Oral     SpO2 09/13/19 1552 97 %     Weight 09/13/19 1550 175 lb (79.4 kg)     Height 09/13/19 1550 5\' 6"  (1.676 m)     Head Circumference --      Peak Flow --      Pain Score 09/13/19 1549 8     Pain Loc --      Pain Edu? --      Excl. in GC? --    Updated Vital Signs BP 127/73 (BP Location: Right Arm)   Pulse 69   Temp 98 F (36.7 C) (Oral)   Resp 18   Ht 5\' 6"  (1.676 m)   Wt 79.4 kg   SpO2 97%   BMI 28.25 kg/m   Visual Acuity Right Eye Distance:   Left Eye Distance:   Bilateral  Distance:    Right Eye Near:   Left Eye Near:    Bilateral Near:     Physical Exam Constitutional:      General: He is not in acute distress.    Appearance: He is well-developed. He is not ill-appearing.  HENT:     Head: Normocephalic and atraumatic.     Right Ear: Tympanic membrane normal.     Left Ear: Tympanic membrane normal.     Nose: Nose normal.     Mouth/Throat:     Pharynx: Uvula midline. Posterior oropharyngeal erythema present. No oropharyngeal exudate or uvula swelling.  Eyes:     General:        Right eye: No discharge.        Left eye: No discharge.     Conjunctiva/sclera: Conjunctivae normal.  Cardiovascular:     Rate and Rhythm: Normal rate.     Heart sounds: No murmur.  Pulmonary:     Effort: Pulmonary effort is normal.     Breath sounds: No wheezing, rhonchi or rales.  Skin:    General: Skin is warm.     Findings: No rash.  Neurological:     Mental Status: He is alert.  Psychiatric:        Behavior: Behavior normal.     Comments:  Flat affect.      UC Treatments / Results  Labs (all labs ordered are listed, but only abnormal results are displayed) Labs Reviewed  RAPID STREP SCREEN ([facility] ONLY)  CULTURE, GROUP A STREP ([place])  NOVEL CORONAVIRUS, NAA (HOSP ORDER, SEND-OUT TO REF LAB; TAT 18-24 HRS)    EKG   Radiology No results found.  Procedures Procedures (including critical care time)  Medications Ordered in UC Medications - No data to display  Initial Impression / Assessment and Plan / UC Course  I have reviewed the triage vital signs and the nursing notes.  Pertinent labs & imaging results that were available during my care of the patient were reviewed by me and considered in my medical decision making (see chart for details).    28 year old male presents with pharyngitis.  Strep negative.  Awaiting culture.  Awaiting Covid test result as well.  Supportive care and viscous lidocaine and ibuprofen as needed.  Final Clinical Impressions(s) / UC Diagnoses   Final diagnoses:  Pharyngitis, unspecified etiology     Discharge Instructions     Strep negative.  Stay home. Awaiting COVID test results.  Medications as directed.  Take care  Dr. Lacinda Axon    ED Prescriptions    Medication Sig Dispense Auth. Provider   lidocaine (XYLOCAINE) 2 % solution Gargle 15 mL every 3 hours as needed. May swallow if desired. 200 mL Laylonie Marzec G, DO   ibuprofen (ADVIL) 800 MG tablet Take 1 tablet (800 mg total) by mouth every 8 (eight) hours as needed for moderate pain. 30 tablet Coral Spikes, DO     PDMP not reviewed this encounter.   Coral Spikes, [place] 09/14/19 1122

## 2019-09-16 LAB — CULTURE, GROUP A STREP (THRC)

## 2022-03-18 ENCOUNTER — Other Ambulatory Visit: Payer: Self-pay

## 2022-03-18 ENCOUNTER — Emergency Department
Admission: EM | Admit: 2022-03-18 | Discharge: 2022-03-18 | Disposition: A | Discharge status: Home / Self Care | Payer: Medicare Other | Attending: Emergency Medicine | Admitting: Emergency Medicine

## 2022-03-18 ENCOUNTER — Encounter: Payer: Self-pay | Admitting: *Deleted

## 2022-03-18 DIAGNOSIS — F17200 Nicotine dependence, unspecified, uncomplicated: Secondary | ICD-10-CM | POA: Diagnosis present

## 2022-03-18 DIAGNOSIS — R440 Auditory hallucinations: Secondary | ICD-10-CM | POA: Insufficient documentation

## 2022-03-18 DIAGNOSIS — R443 Hallucinations, unspecified: Secondary | ICD-10-CM

## 2022-03-18 DIAGNOSIS — F1220 Cannabis dependence, uncomplicated: Secondary | ICD-10-CM | POA: Diagnosis not present

## 2022-03-18 DIAGNOSIS — Z720 Tobacco use: Secondary | ICD-10-CM | POA: Diagnosis not present

## 2022-03-18 DIAGNOSIS — F203 Undifferentiated schizophrenia: Secondary | ICD-10-CM | POA: Diagnosis present

## 2022-03-18 LAB — COMPREHENSIVE METABOLIC PANEL
ALT: 47 U/L — ABNORMAL HIGH (ref 0–44)
AST: 23 U/L (ref 15–41)
Albumin: 4.6 g/dL (ref 3.5–5.0)
Alkaline Phosphatase: 64 U/L (ref 38–126)
Anion gap: 6 (ref 5–15)
BUN: 9 mg/dL (ref 6–20)
CO2: 29 mmol/L (ref 22–32)
Calcium: 9.2 mg/dL (ref 8.9–10.3)
Chloride: 106 mmol/L (ref 98–111)
Creatinine, Ser: 1.04 mg/dL (ref 0.61–1.24)
GFR, Estimated: >60 mL/min (ref >60)
Glucose, Bld: 96 mg/dL (ref 70–99)
Potassium: 4.1 mmol/L (ref 3.5–5.1)
Sodium: 141 mmol/L (ref 135–145)
Total Bilirubin: 1 mg/dL (ref 0.3–1.2)
Total Protein: 7.9 g/dL (ref 6.5–8.1)

## 2022-03-18 LAB — CBC
HCT: 55.6 % — ABNORMAL HIGH (ref 39.0–52.0)
Hemoglobin: 18.5 g/dL — ABNORMAL HIGH (ref 13.0–17.0)
MCH: 28.8 pg (ref 26.0–34.0)
MCHC: 33.3 g/dL (ref 30.0–36.0)
MCV: 86.5 fL (ref 80.0–100.0)
Platelets: 194 10*3/uL (ref 150–400)
RBC: 6.43 MIL/uL — ABNORMAL HIGH (ref 4.22–5.81)
RDW: 12 % (ref 11.5–15.5)
WBC: 9.9 10*3/uL (ref 4.0–10.5)
nRBC: 0 % (ref 0.0–0.2)

## 2022-03-18 LAB — URINE DRUG SCREEN, QUALITATIVE (ARMC ONLY)
Amphetamines, Ur Screen: NOT DETECTED
Barbiturates, Ur Screen: NOT DETECTED
Benzodiazepine, Ur Scrn: NOT DETECTED
Cannabinoid 50 Ng, Ur ~~LOC~~: NOT DETECTED
Cocaine Metabolite,Ur ~~LOC~~: POSITIVE — AB
MDMA (Ecstasy)Ur Screen: NOT DETECTED
Methadone Scn, Ur: NOT DETECTED
Opiate, Ur Screen: NOT DETECTED
Phencyclidine (PCP) Ur S: NOT DETECTED
Tricyclic, Ur Screen: NOT DETECTED

## 2022-03-18 LAB — SALICYLATE LEVEL: Salicylate Lvl: <7 mg/dL — ABNORMAL LOW (ref 7.0–30.0)

## 2022-03-18 LAB — ETHANOL: Alcohol, Ethyl (B): <10 mg/dL (ref <10)

## 2022-03-18 LAB — ACETAMINOPHEN LEVEL: Acetaminophen (Tylenol), Serum: <10 ug/mL — ABNORMAL LOW (ref 10–30)

## 2022-03-18 NOTE — ED Provider Notes (Signed)
[facility] Provider Note    Event Date/Time   First MD Initiated Contact with Patient 03/18/22 1957     (approximate)   History   Psychiatric Evaluation   HPI  Justin Schaefer is a 31 y.o. male past medical history of schizophrenia with auditory hallucinations.  Patient here is hearing voices and has been for some time he would like medication to help with this.  There is no command hallucinations denies suicidal ideation.  Does have diagnosis of schizophrenia had been on Abilify in the past but has not been on it in some time.  He lives at home with family.  Denies drug or alcohol use.  No other medical issues currently.    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 02/15/2015     Physical Exam  Triage Vital Signs: ED Triage Vitals  Enc Vitals Group     BP 03/18/22 1945 (!) 141/85     Pulse Rate 03/18/22 1945 67     Resp 03/18/22 1945 16     Temp 03/18/22 1945 98.2 F (36.8 C)     Temp Source 03/18/22 1945 Oral     SpO2 03/18/22 1945 96 %     Weight 03/18/22 1944 162 lb (73.5 kg)     Height 03/18/22 1944 5\' 6"  (1.676 m)     Head Circumference --      Peak Flow --      Pain Score 03/18/22 1951 0     Pain Loc --      Pain Edu? --      Excl. in GC? --     Most recent vital signs: Vitals:   03/18/22 1945  BP: (!) 141/85  Pulse: 67  Resp: 16  Temp: 98.2 F (36.8 C)  SpO2: 96%     General: Awake, no distress.  CV:  Good peripheral perfusion.  Resp:  Normal effort.  Abd:  No distention.  Neuro:             Awake, Alert, Oriented x 3  Other:  Chronic deformity to the left skull and ear   ED Results / Procedures / Treatments   Labs (all labs ordered are listed, but only abnormal results are displayed) Labs Reviewed  COMPREHENSIVE METABOLIC PANEL - Abnormal; Notable for the following components:      Result Value   ALT 47 (*)    All other components within normal limits  SALICYLATE LEVEL - Abnormal; Notable for the following components:   Salicylate Lvl <7.0 (*)    All other components within normal limits  ACETAMINOPHEN LEVEL - Abnormal; Notable for the following components:   Acetaminophen (Tylenol), Serum <10 (*)    All other components within normal limits  CBC - Abnormal; Notable for the following components:   RBC 6.43 (*)    Hemoglobin 18.5 (*)    HCT 55.6 (*)    All other components within normal limits  URINE DRUG SCREEN, QUALITATIVE ([facility] ONLY) - Abnormal; Notable for the following components:   Cocaine Metabolite,Ur [place] POSITIVE (*)    All other components within normal limits  RESP PANEL BY RT-PCR (FLU A&B, COVID) ARPGX2  ETHANOL     EKG     RADIOLOGY    PROCEDURES:  Critical Care performed: No  Procedures    MEDICATIONS ORDERED IN ED: Medications - No data to display   IMPRESSION / MDM / ASSESSMENT AND PLAN / ED COURSE  I reviewed the triage vital signs and the nursing notes.                              Patient's presentation is most consistent with acute presentation with potential threat to life or bodily function.  Differential diagnosis includes, but is not limited to, decompensated schizophrenia, drug-induced psychosis  Is a 31 year old male known schizophrenia presents with auditory hallucinations he is not suicidal has no command hallucinations.  He is looking for medication.  Has not been on Abilify in some time.  Vitals are within normal limits.  Notable for hemoglobin of 18.5 otherwise within normal limits normal Tylenol salicylate level.  He is calm and cooperative does not appear to be interacting to internal stimuli.  Plan to consult psychiatry however do  not feel that he needs inpatient treatment at this time will not IVC.  The patient has been placed in psychiatric observation due to the need to provide a safe environment for the patient while obtaining psychiatric consultation and evaluation, as well as ongoing medical and medication management to treat the patient's condition.  The patient has not been placed under full IVC at this time.        FINAL CLINICAL IMPRESSION(S) / ED DIAGNOSES   Final diagnoses:  Hallucinations     Rx / DC Orders   ED Discharge Orders     None        Note:  This document was prepared using Dragon voice recognition software and may include unintentional dictation errors.   Georga Hacking, MD 03/18/22 2134

## 2022-03-18 NOTE — ED Notes (Signed)
Pt refused snack but received a drink 

## 2022-03-18 NOTE — ED Notes (Signed)
Psych NP at bedside

## 2022-03-18 NOTE — ED Notes (Signed)
Pt. Alert and oriented, warm and dry, in no distress. Pt. Denies SI, HI, and VH. Pt states having auditory hallucinations. Per patient he was taking monthly Abilify injections from [facility] until a few months ago when he stopped. Pt states he was hearing the voices even on the Abilify and cannot tell a difference since stop taking and is wanting help.  Per patient the voices are just chatter. Pt. Encouraged to let nursing staff know of any concerns or needs.   ENVIRONMENTAL ASSESSMENT Potentially harmful objects out of patient reach: Yes.   Personal belongings secured: Yes.   Patient dressed in hospital provided attire only: Yes.   Plastic bags out of patient reach: Yes.   Patient care equipment (cords, cables, call bells, lines, and drains) shortened, removed, or accounted for: Yes.   Equipment and supplies removed from bottom of stretcher: Yes.   Potentially toxic materials out of patient reach: Yes.   Sharps container removed or out of patient reach: Yes.

## 2022-03-18 NOTE — ED Notes (Signed)
Pt dressed in burgundy scrubs with myself and Amy RN present.  Pts belongings, include keys, lighter, wallet (with no cash), cellphone with cracked screen, shorts, underwear, tshirt, hat, bagged, labelled and placed at nurses station.  Pt ambulated to [facility].  Pt very cooperative

## 2022-03-18 NOTE — ED Triage Notes (Signed)
Pt reports hearing voices.  Pt brought in by [employer].  Pt denies SI or HI  pt denies etoh or drug use.  Pt calm and cooperative,

## 2022-03-19 NOTE — Consult Note (Signed)
[facility] Face-to-Face Psychiatry Consult   Reason for Consult: Psychiatric Evaluation Referring Physician:Dr. McHugh Patient Identification: Justin Schaefer MRN:  [id number] Principal Diagnosis: <principal problem not specified> Diagnosis:  Active Problems:   Tobacco use disorder severe.   Undifferentiated schizophrenia (HCC)   Total Time spent with patient: 1 hour  Subjective: "I was on an injection a couple of months ago." Justin Schaefer is a 31 y.o. male patient presented to [facility] ED via POV and voluntarily. Per the ED triage nurse's note, Pt reports hearing voices.  Pt brought in by the [employer].  Pt denies SI or HI. Pt prohibits etoh or drug use.  Pt calm and cooperative. The patient was seen voicing that he is having AH but cannot tell us what he hears. He states He was on Abilify injection but took himself off the medication because he did not like how it made him feel. The patient was with an ACCT team, but he canceled service with them.  This provider saw The patient face-to-face; the chart was reviewed, and consulted with Dr.McHugh on 03/18/2022 due to the patient's care. It was discussed with the EDP that the patient does not meet the criteria to be admitted to the inpatient unit.  On evaluation, the patient is alert and oriented x4, calm and cooperative, and mood-congruent with affect. He is voicing, "I don't need to be here." I want to go home. The patient does not appear to be responding to internal or external stimuli. Neither is the patient presenting with any delusional thinking. The patient admitted auditory or visual hallucinations. The patient denies any suicidal, homicidal, or self-harm ideations. The patient is not presenting with any psychotic or paranoid behaviors. During an encounter with the patient, they could answer questions appropriately.  HPI: Per Dr. Sidney Ace, Justin Schaefer is a 31 y.o. male past medical history of schizophrenia with auditory  hallucinations.  Patient here is hearing voices and has been for some time he would like medication to help with this.  There is no command hallucinations denies suicidal ideation.  Does have diagnosis of schizophrenia had been on Abilify in the past but has not been on it in some time.  He lives at home with family.  Denies drug or alcohol use.  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 Family Psychiatric  History:  Social History:  Social History   Substance and Sexual Activity  Alcohol Use No     Social History   Substance and Sexual Activity  Drug Use Yes   Types: Marijuana    Social History   Socioeconomic History   Marital status: Single    Spouse name: Not on file   Number of children: Not on file   Years of education: Not on file   Highest education level: Not on file  Occupational History   Not on file  Tobacco Use   Smoking status: Every Day    Packs/day: 0.25    Types: Cigarettes   Smokeless tobacco: Never  Substance and Sexual Activity   Alcohol use:  No   Drug use: Yes    Types: Marijuana   Sexual activity: Never  Other Topics Concern   Not on file  Social History Narrative   Not on file   Social Determinants of Health   Financial Resource Strain: Not on file  Food Insecurity: Not on file  Transportation Needs: Not on file  Physical Activity: Not on file  Stress: Not on file  Social Connections: Not on file   Additional Social History:    Allergies:  No Known Allergies  Labs:  Results for orders placed or performed during the hospital encounter of 03/18/22 (from the  past 48 hour(s))  Comprehensive metabolic panel     Status: Abnormal   Collection Time: 03/18/22  7:45 PM  Result Value Ref Range   Sodium 141 135 - 145 mmol/L   Potassium 4.1 3.5 - 5.1 mmol/L   Chloride 106 98 - 111 mmol/L   CO2 29 22 - 32 mmol/L   Glucose, Bld 96 70 - 99 mg/dL    Comment: Glucose reference range applies only to samples taken after fasting for at least 8 hours.   BUN 9 6 - 20 mg/dL   Creatinine, Ser 0.25 0.61 - 1.24 mg/dL   Calcium 9.2 8.9 - 42.7 mg/dL   Total Protein 7.9 6.5 - 8.1 g/dL   Albumin 4.6 3.5 - 5.0 g/dL   AST 23 15 - 41 U/L   ALT 47 (H) 0 - 44 U/L   Alkaline Phosphatase 64 38 - 126 U/L   Total Bilirubin 1.0 0.3 - 1.2 mg/dL   GFR, Estimated >06 >23 mL/min    Comment: (NOTE) Calculated using the CKD-EPI Creatinine Equation (2021)    Anion gap 6 5 - 15    Comment: Performed at [facility], [address] [place], [place] [address]  Ethanol     Status: None   Collection Time: 03/18/22  7:45 PM  Result Value Ref Range   Alcohol, Ethyl (B) <10 <10 mg/dL    Comment: (NOTE) Lowest detectable limit for serum alcohol is 10 mg/dL.  For medical purposes only. Performed at [facility], [address] [place], [place] [address]   Salicylate level     Status: Abnormal   Collection Time: 03/18/22  7:45 PM  Result Value Ref Range   Salicylate Lvl <7.0 (L) 7.0 - 30.0 mg/dL    Comment: Performed at [facility], [address] [place], [place] [address]  Acetaminophen level     Status: Abnormal   Collection Time: 03/18/22  7:45 PM  Result Value Ref Range   Acetaminophen (Tylenol), Serum <10 (L) 10 - 30 ug/mL    Comment: (NOTE) Therapeutic concentrations vary significantly. A range of 10-30 ug/mL  may be an effective concentration for many patients. However, some  are best treated at concentrations outside of this range. Acetaminophen concentrations >150 ug/mL at 4 hours after ingestion  and >50 ug/mL at 12 hours after  ingestion are often associated with  toxic reactions.  Performed at [facility], [address] [place], [place] [address]   cbc     Status: Abnormal   Collection Time: 03/18/22  7:45 PM  Result Value Ref Range   WBC 9.9 4.0 - 10.5 K/uL   RBC 6.43 (H) 4.22 - 5.81 MIL/uL   Hemoglobin 18.5 (H) 13.0 - 17.0 g/dL   HCT 69.4 (H) 85.4 - 62.7 %   MCV 86.5 80.0 - 100.0 fL   MCH 28.8  26.0 - 34.0 pg   MCHC 33.3 30.0 - 36.0 g/dL   RDW 88.5 02.7 - 74.1 %   Platelets 194 150 - 400 K/uL   nRBC 0.0 0.0 - 0.2 %    Comment: Performed at [facility], [address], [place], [place] [address]  Urine Drug Screen, Qualitative     Status: Abnormal   Collection Time: 03/18/22  7:45 PM  Result Value Ref Range   Tricyclic, Ur Screen NONE DETECTED NONE DETECTED   Amphetamines, Ur Screen NONE DETECTED NONE DETECTED   MDMA (Ecstasy)Ur Screen NONE DETECTED NONE DETECTED   Cocaine Metabolite,Ur [place] POSITIVE (A) NONE DETECTED   Opiate, Ur Screen NONE DETECTED NONE DETECTED   Phencyclidine (PCP) Ur S NONE DETECTED NONE DETECTED   Cannabinoid 50 Ng, Ur [place] NONE DETECTED NONE DETECTED   Barbiturates, Ur Screen NONE DETECTED NONE DETECTED   Benzodiazepine, Ur Scrn NONE DETECTED NONE DETECTED   Methadone Scn, Ur NONE DETECTED NONE DETECTED    Comment: (NOTE) Tricyclics + metabolites, urine    Cutoff 1000 ng/mL Amphetamines + metabolites, urine  Cutoff 1000 ng/mL MDMA (Ecstasy), urine              Cutoff 500 ng/mL Cocaine Metabolite, urine          Cutoff 300 ng/mL Opiate + metabolites, urine        Cutoff 300 ng/mL Phencyclidine (PCP), urine         Cutoff 25 ng/mL Cannabinoid, urine                 Cutoff 50 ng/mL Barbiturates + metabolites, urine  Cutoff 200 ng/mL Benzodiazepine, urine              Cutoff 200 ng/mL Methadone, urine                   Cutoff 300 ng/mL  The urine drug screen provides only a preliminary, unconfirmed analytical test result and should not be used for  non-medical purposes. Clinical consideration and professional judgment should be applied to any positive drug screen result due to possible interfering substances. A more specific alternate chemical method must be used in order to obtain a confirmed analytical result. Gas chromatography / mass spectrometry (GC/MS) is the preferred confirm atory method. Performed at [facility], [address] [place], [place] [address]     No current facility-administered medications for this encounter.   Current Outpatient Medications  Medication Sig Dispense Refill   ARIPiprazole (ABILIFY) 30 MG tablet Take 1 tablet (30 mg total) by mouth daily. (Patient not taking: Reported on 03/18/2022) 30 tablet 0   ibuprofen (ADVIL) 800 MG tablet Take 1 tablet (800 mg total) by mouth every 8 (eight) hours as needed for moderate pain. (Patient not taking: Reported on 03/18/2022) 30 tablet 0   lidocaine (XYLOCAINE) 2 % solution Gargle 15 mL every 3 hours as needed. May swallow if desired. (Patient not taking: Reported on 03/18/2022) 200 mL 0    Musculoskeletal: Strength & Muscle Tone: within normal limits Gait & Station: normal Patient leans: N/A  Psychiatric Specialty Exam:  Presentation  General Appearance: Bizarre  Eye Contact:Good  Speech:Clear and Coherent  Speech Volume:Normal  Handedness:Right   Mood and Affect  Mood:Anxious; Depressed  Affect:Blunt; Congruent   Thought Process  Thought Processes:Coherent  Descriptions of Associations:Intact  Orientation:Full (Time, Place and Person)  Thought Content:Logical  History of Schizophrenia/Schizoaffective disorder:No data recorded Duration of Psychotic Symptoms:No data recorded Hallucinations:Hallucinations: Auditory Description  of Auditory Hallucinations: He does not share  Ideas of Reference:None  Suicidal Thoughts:Suicidal Thoughts: No  Homicidal Thoughts:Homicidal Thoughts: No   Sensorium  Memory:Immediate Good;  Recent Good; Remote Good  Judgment:Fair  Insight:Poor   Executive Functions  Concentration:Fair  Attention Span:Fair  Recall:Fair  Fund of Knowledge:Fair  Language:Fair   Psychomotor Activity  Psychomotor Activity:Psychomotor Activity: Normal   Assets  Assets:Communication Skills; Desire for Improvement; Social Support   Sleep  Sleep:Sleep: Good Number of Hours of Sleep: 10   Physical Exam: Physical Exam Vitals and nursing note reviewed.  Constitutional:      Appearance: Normal appearance. He is normal weight.  HENT:     Head: Normocephalic and atraumatic.     Right Ear: External ear normal.     Left Ear: External ear normal.     Nose: Nose normal.  Cardiovascular:     Rate and Rhythm: Normal rate.     Pulses: Normal pulses.  Pulmonary:     Effort: Pulmonary effort is normal.  Musculoskeletal:        General: Normal range of motion.     Cervical back: Normal range of motion and neck supple.  Neurological:     General: No focal deficit present.     Mental Status: He is alert and oriented to person, place, and time.  Psychiatric:        Mood and Affect: Mood normal.        Behavior: Behavior normal.    ROS Blood pressure (!) 135/97, pulse 87, temperature 98.1 F (36.7 C), temperature source Oral, resp. rate 16, height 5\' 6"  (1.676 m), weight 73.5 kg, SpO2 99 %. Body mass index is 26.15 kg/m.  Treatment Plan Summary: Plan Patient does not meet criteria for geriatric-psychiatric inpatient admission  Disposition: No evidence of imminent risk to self or others at present.   Patient does not meet criteria for psychiatric inpatient admission. Supportive therapy provided about ongoing stressors. Discussed crisis plan, support from social network, calling 911, coming to the Emergency Department, and calling Suicide Hotline.  , NP 03/19/2022 1:32 AM

## 2022-05-05 ENCOUNTER — Other Ambulatory Visit: Payer: Self-pay

## 2022-05-05 ENCOUNTER — Emergency Department
Admission: EM | Admit: 2022-05-05 | Discharge: 2022-05-06 | Disposition: A | Discharge status: Other Acute Inpatient Hospital | Payer: Medicare Other | Attending: Emergency Medicine | Admitting: Emergency Medicine

## 2022-05-05 ENCOUNTER — Encounter: Payer: Self-pay | Admitting: Emergency Medicine

## 2022-05-05 DIAGNOSIS — R440 Auditory hallucinations: Secondary | ICD-10-CM

## 2022-05-05 DIAGNOSIS — F17210 Nicotine dependence, cigarettes, uncomplicated: Secondary | ICD-10-CM | POA: Diagnosis not present

## 2022-05-05 DIAGNOSIS — Z20822 Contact with and (suspected) exposure to covid-19: Secondary | ICD-10-CM | POA: Diagnosis not present

## 2022-05-05 DIAGNOSIS — F209 Schizophrenia, unspecified: Secondary | ICD-10-CM | POA: Diagnosis not present

## 2022-05-05 DIAGNOSIS — R443 Hallucinations, unspecified: Secondary | ICD-10-CM

## 2022-05-05 LAB — COMPREHENSIVE METABOLIC PANEL WITH GFR
ALT: 35 U/L (ref 0–44)
AST: 22 U/L (ref 15–41)
Albumin: 4.4 g/dL (ref 3.5–5.0)
Alkaline Phosphatase: 61 U/L (ref 38–126)
Anion gap: 6 (ref 5–15)
BUN: 14 mg/dL (ref 6–20)
CO2: 27 mmol/L (ref 22–32)
Calcium: 9 mg/dL (ref 8.9–10.3)
Chloride: 106 mmol/L (ref 98–111)
Creatinine, Ser: 1.03 mg/dL (ref 0.61–1.24)
GFR, Estimated: >60 mL/min
Glucose, Bld: 145 mg/dL — ABNORMAL HIGH (ref 70–99)
Potassium: 4.1 mmol/L (ref 3.5–5.1)
Sodium: 139 mmol/L (ref 135–145)
Total Bilirubin: 0.8 mg/dL (ref 0.3–1.2)
Total Protein: 7.7 g/dL (ref 6.5–8.1)

## 2022-05-05 LAB — URINE DRUG SCREEN, QUALITATIVE (ARMC ONLY)
Amphetamines, Ur Screen: NOT DETECTED
Barbiturates, Ur Screen: NOT DETECTED
Benzodiazepine, Ur Scrn: NOT DETECTED
Cannabinoid 50 Ng, Ur ~~LOC~~: NOT DETECTED
Cocaine Metabolite,Ur ~~LOC~~: NOT DETECTED
MDMA (Ecstasy)Ur Screen: NOT DETECTED
Methadone Scn, Ur: NOT DETECTED
Opiate, Ur Screen: NOT DETECTED
Phencyclidine (PCP) Ur S: NOT DETECTED
Tricyclic, Ur Screen: NOT DETECTED

## 2022-05-05 LAB — SALICYLATE LEVEL: Salicylate Lvl: <7 mg/dL — ABNORMAL LOW (ref 7.0–30.0)

## 2022-05-05 LAB — SARS CORONAVIRUS 2 BY RT PCR: SARS Coronavirus 2 by RT PCR: NEGATIVE

## 2022-05-05 LAB — CBC
HCT: 57.2 % — ABNORMAL HIGH (ref 39.0–52.0)
Hemoglobin: 18.7 g/dL — ABNORMAL HIGH (ref 13.0–17.0)
MCH: 28.1 pg (ref 26.0–34.0)
MCHC: 32.7 g/dL (ref 30.0–36.0)
MCV: 86 fL (ref 80.0–100.0)
Platelets: 180 K/uL (ref 150–400)
RBC: 6.65 MIL/uL — ABNORMAL HIGH (ref 4.22–5.81)
RDW: 12.4 % (ref 11.5–15.5)
WBC: 8.1 K/uL (ref 4.0–10.5)
nRBC: 0 % (ref 0.0–0.2)

## 2022-05-05 LAB — ETHANOL: Alcohol, Ethyl (B): <10 mg/dL

## 2022-05-05 LAB — ACETAMINOPHEN LEVEL: Acetaminophen (Tylenol), Serum: <10 ug/mL — ABNORMAL LOW (ref 10–30)

## 2022-05-05 NOTE — ED Notes (Signed)
Dinner tray given

## 2022-05-05 NOTE — BH Assessment (Signed)
Adult MH  Referral information for Psychiatric Hospitalization faxed to:   [facility] ([phone]-or- [phone]),   [facility] ([phone]),   [facility] ([phone] -or- [phone]),   [facility] ([phone]---[phone]---[phone]),   [facility] ([phone] or [phone])   [facility] ([phone] or [phone]),   [facility] ([phone]) 

## 2022-05-05 NOTE — ED Notes (Signed)
Snack and beverage given. 

## 2022-05-05 NOTE — ED Notes (Signed)
Pt states that he would like to be discharged. Denies SI, HI, AVH at this time. Pt is hesitant with answering AH questions but notes show he has chronic AH.

## 2022-05-05 NOTE — ED Notes (Signed)
Pt informed this nurse that he would like to be discharged home. When pt asked why he would like to go home he states he came in with PD but just doesn't think he needs to be here anymore and would like to go home. This nurse informed Louise NP who states he can be re-evaluated this evening and they will decide at that time if he is safe to go home.

## 2022-05-05 NOTE — BH Assessment (Signed)
PATIENT BED AVAILABLE AFTER 9AM 05/06/22  Patient has been accepted to [facility].  Accepting physician is Dr. Landry Mellow.  Call report to [phone].  Representative was Windell Moulding.   ER Staff is aware of it:  [facility] ER Secretary  Dr. Larinda Buttery, ER MD  Thayer Ohm Patient's Nurse

## 2022-05-05 NOTE — ED Notes (Signed)
Dr. Vicente Males states that pt is vol and if he would like to leave then he can. Requests that psych be contacted to reassess to evaluate if pt should be IVC'd. Pt is resting comfortably and patiently.

## 2022-05-05 NOTE — ED Triage Notes (Signed)
Patient states the sheriff brought him here voluntarily for hearing voices. States he has been hearing voices for a long time however worse since June. Patient reports feeling depressed but denies any SI/HI. Patient requesting to be checked out for "any diseases."

## 2022-05-05 NOTE — ED Provider Notes (Signed)
[facility] Provider Note    Event Date/Time   First MD Initiated Contact with Patient 05/05/22 1523     (approximate)   History   Chief Complaint Hallucinations   HPI  AMONTAE NG is a 31 y.o. male with past medical history of schizophrenia and deafness status postcochlear implant who presents to the ED complaining of hallucinations.  Patient reports that for about the past week he has been hearing voices, states he does not remember what these voices are saying to him.  He does state that he has been feeling increasingly depressed with occasional thoughts of harming himself, denies any current suicidal ideation or homicidal ideation.  He states he has been off his medication "for a long time" and would like to start back on medication.  He denies any medical complaints at this time, denies any alcohol or drug abuse.     Physical Exam   Triage Vital Signs: ED Triage Vitals [05/05/22 1456]  Enc Vitals Group     BP 136/85     Pulse Rate (!) 55     Resp 16     Temp 98 F (36.7 C)     Temp Source Oral     SpO2 99 %     Weight 160 lb (72.6 kg)     Height 5\' 5"  (1.651 m)     Head Circumference      Peak Flow      Pain Score 3     Pain Loc      Pain Edu?      Excl. in GC?     Most recent vital signs: Vitals:   05/05/22 1456  BP: 136/85  Pulse: (!) 55  Resp: 16  Temp: 98 F (36.7 C)  SpO2: 99%    Constitutional: Alert and oriented. Eyes: Conjunctivae are normal. Head: Atraumatic. Nose: No congestion/rhinnorhea. Mouth/Throat: Mucous membranes are moist.  Cardiovascular: Normal rate, regular rhythm. Grossly normal heart sounds.  2+ radial pulses bilaterally. Respiratory: Normal respiratory effort.  No retractions. Lungs CTAB. Gastrointestinal: Soft and nontender. No distention. Musculoskeletal: No lower extremity tenderness nor edema.  Neurologic:  Normal speech and language. No gross focal neurologic deficits are  appreciated.    ED Results / Procedures / Treatments   Labs (all labs ordered are listed, but only abnormal results are displayed) Labs Reviewed  COMPREHENSIVE METABOLIC PANEL - Abnormal; Notable for the following components:      Result Value   Glucose, Bld 145 (*)    All other components within normal limits  SALICYLATE LEVEL - Abnormal; Notable for the following components:   Salicylate Lvl <7.0 (*)    All other components within normal limits  ACETAMINOPHEN LEVEL - Abnormal; Notable for the following components:   Acetaminophen (Tylenol), Serum <10 (*)    All other components within normal limits  CBC - Abnormal; Notable for the following components:   RBC 6.65 (*)    Hemoglobin 18.7 (*)    HCT 57.2 (*)    All other components within normal limits  ETHANOL  URINE DRUG SCREEN, QUALITATIVE ([facility] ONLY)    PROCEDURES:  Critical Care performed: No  Procedures   MEDICATIONS ORDERED IN ED: Medications - No data to display   IMPRESSION / MDM / ASSESSMENT AND PLAN / ED COURSE  I reviewed the triage vital signs and the nursing notes.  31 y.o. male with past medical history of schizophrenia and cochlear implant who presents to the ED complaining of hallucinations and depression with occasional suicidal ideation but none currently.  Patient's presentation is most consistent with acute presentation with potential threat to life or bodily function.  Differential diagnosis includes, but is not limited to, psychosis, suicidal ideation, homicidal ideation, substance abuse, medication noncompliance, electrolyte abnormality.  Patient well-appearing and in no acute distress, vital signs are unremarkable and he denies any medical complaints.  Screening labs are unremarkable with no significant anemia, leukocytosis, electrolyte abnormality, or AKI.  Tylenol and salicylate levels are undetectable and patient may be medically cleared for psychiatric  disposition.  We will maintain voluntary status for now as he is calm and cooperative, desiring treatment for his psychiatric illness.  The patient has been placed in psychiatric observation due to the need to provide a safe environment for the patient while obtaining psychiatric consultation and evaluation, as well as ongoing medical and medication management to treat the patient's condition.  The patient has not been placed under full IVC at this time.      FINAL CLINICAL IMPRESSION(S) / ED DIAGNOSES   Final diagnoses:  Hallucinations  Schizophrenia, unspecified type (HCC)     Rx / DC Orders   ED Discharge Orders     None        Note:  This document was prepared using Dragon voice recognition software and may include unintentional dictation errors.   Chesley Noon, MD 05/05/22 [phone]

## 2022-05-05 NOTE — ED Notes (Signed)
VOL  PENDING  CONSULT  MOVED  TO  [facility]

## 2022-05-05 NOTE — ED Notes (Signed)
Report received from Andrea, RN including SBAR. Patient alert and oriented, warm and dry, in no acute distress. Patient denies SI, HI, AVH and pain. Patient made aware of Q15 minute rounds and security cameras for their safety. Patient instructed to come to this nurse with needs or concerns.  

## 2022-05-05 NOTE — ED Notes (Signed)
Pt moved to [facility] room 3. Oriented pt to the department and explained policies and expectations. Pt verbalized understanding.

## 2022-05-05 NOTE — ED Notes (Signed)
Psych np and TTS in to speak with pt. Pt to stay, Annice Pih reports she will IVC pt to ensure he does not leave later before admission
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
## 2022-05-05 NOTE — ED Notes (Signed)
VOL/  PENDING  CONSULT 

## 2022-05-05 NOTE — ED Notes (Signed)
Secure chat sent to Dr. Vicente Males about pts discharge request. Pt resting patiently in room, will keep pt updated. Pt understands vol status, will follow up.
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
## 2022-05-05 NOTE — ED Notes (Signed)
[facility] NP is contacted. She is informed of situation, states that she will not discharge pt as prior provider referred inpatient admission and will call Dr. Vicente Males at this time. Will continue to follow

## 2022-05-06 NOTE — ED Notes (Signed)
Rn attempted to call report again without success

## 2022-05-06 NOTE — ED Notes (Addendum)
Attempted to call report x2 without success.

## 2022-05-06 NOTE — ED Notes (Signed)
IVC/Accepted to [facility] this am

## 2022-05-06 NOTE — ED Notes (Signed)
Rn attempted to call report again without success 

## 2022-05-06 NOTE — ED Notes (Signed)
[facility]  Dept  called  for  transport to  [facility]

## 2022-05-27 ENCOUNTER — Other Ambulatory Visit: Payer: Self-pay

## 2022-05-27 ENCOUNTER — Ambulatory Visit
Admission: EM | Admit: 2022-05-27 | Discharge: 2022-05-27 | Discharge status: Against Medical Advice | Payer: Medicare Other | Attending: Family Medicine | Admitting: Family Medicine

## 2022-05-27 ENCOUNTER — Encounter: Payer: Self-pay | Admitting: Emergency Medicine

## 2022-05-27 DIAGNOSIS — B9789 Other viral agents as the cause of diseases classified elsewhere: Secondary | ICD-10-CM | POA: Insufficient documentation

## 2022-05-27 DIAGNOSIS — Z20822 Contact with and (suspected) exposure to covid-19: Secondary | ICD-10-CM | POA: Diagnosis not present

## 2022-05-27 DIAGNOSIS — R0789 Other chest pain: Secondary | ICD-10-CM | POA: Diagnosis present

## 2022-05-27 DIAGNOSIS — J029 Acute pharyngitis, unspecified: Secondary | ICD-10-CM | POA: Insufficient documentation

## 2022-05-27 LAB — CBC WITH DIFFERENTIAL/PLATELET
Abs Immature Granulocytes: 0.04 10*3/uL (ref 0.00–0.07)
Basophils Absolute: 0.1 10*3/uL (ref 0.0–0.1)
Basophils Relative: 1 %
Eosinophils Absolute: 0.1 10*3/uL (ref 0.0–0.5)
Eosinophils Relative: 1 %
HCT: 55.4 % — ABNORMAL HIGH (ref 39.0–52.0)
Hemoglobin: 18.8 g/dL — ABNORMAL HIGH (ref 13.0–17.0)
Immature Granulocytes: 0 %
Lymphocytes Relative: 19 %
Lymphs Abs: 1.9 10*3/uL (ref 0.7–4.0)
MCH: 29.2 pg (ref 26.0–34.0)
MCHC: 33.9 g/dL (ref 30.0–36.0)
MCV: 86.2 fL (ref 80.0–100.0)
Monocytes Absolute: 1 10*3/uL (ref 0.1–1.0)
Monocytes Relative: 10 %
Neutro Abs: 6.8 10*3/uL (ref 1.7–7.7)
Neutrophils Relative %: 69 %
Platelets: 191 10*3/uL (ref 150–400)
RBC: 6.43 MIL/uL — ABNORMAL HIGH (ref 4.22–5.81)
RDW: 12.8 % (ref 11.5–15.5)
WBC: 10 10*3/uL (ref 4.0–10.5)
nRBC: 0 % (ref 0.0–0.2)

## 2022-05-27 LAB — COMPREHENSIVE METABOLIC PANEL
ALT: 46 U/L — ABNORMAL HIGH (ref 0–44)
AST: 35 U/L (ref 15–41)
Albumin: 4.4 g/dL (ref 3.5–5.0)
Alkaline Phosphatase: 59 U/L (ref 38–126)
Anion gap: 5 (ref 5–15)
BUN: 13 mg/dL (ref 6–20)
CO2: 31 mmol/L (ref 22–32)
Calcium: 9.8 mg/dL (ref 8.9–10.3)
Chloride: 102 mmol/L (ref 98–111)
Creatinine, Ser: 0.99 mg/dL (ref 0.61–1.24)
GFR, Estimated: >60 mL/min (ref >60)
Glucose, Bld: 102 mg/dL — ABNORMAL HIGH (ref 70–99)
Potassium: 4.1 mmol/L (ref 3.5–5.1)
Sodium: 138 mmol/L (ref 135–145)
Total Bilirubin: 0.7 mg/dL (ref 0.3–1.2)
Total Protein: 8.1 g/dL (ref 6.5–8.1)

## 2022-05-27 LAB — GROUP A STREP BY PCR: Group A Strep by PCR: NOT DETECTED

## 2022-05-27 LAB — SARS CORONAVIRUS 2 BY RT PCR: SARS Coronavirus 2 by RT PCR: NEGATIVE

## 2022-05-27 NOTE — ED Provider Notes (Signed)
[facility]    CSN: [id number] Arrival date & time: 05/27/22  1048      History   Chief Complaint Chief Complaint  Patient presents with   Sore Throat    HPI Justin Schaefer is a 31 y.o. male.   HPI   Justin Schaefer presents for sore throat started 4 days ago. Has headache  and body aches for the past week.  He has not taken a COVID test.  Endorses substernal chest discomfort and some mild shortness of breath.  Has some neck pain.  No fever, vomiting.  No one around him has been sick.  He has had the shocks before and was given oxycodone for them with relief.  He would like to have more oxycodone.  He took Alleve and he started having "shocks throughout my body" which lasted throughout the night. he has taken ibuprofen in the past. He does not want to take any more over-the-counter medicine because he feels like this is causing his shocks.  He has a cochlear implant.  Has history of cocaine abuse but has not used this in several years.  Endorses THC use 2 to 3 months ago.  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 02/15/2015    Past Surgical History:  Procedure Laterality Date   COCHLEAR IMPLANT         Home Medications    Prior to Admission medications   Medication Sig Start Date End Date Taking? Authorizing Provider  ARIPiprazole (ABILIFY) 30 MG tablet Take 1 tablet (30 mg total) by mouth daily. Patient not taking: Reported on 03/18/2022 01/21/16   Jimmy Footman, MD  ibuprofen (ADVIL) 800 MG tablet Take 1 tablet (800 mg total) by mouth  every 8 (eight) hours as needed for moderate pain. Patient not taking: Reported on 03/18/2022 09/13/19   Tommie Sams, DO  lidocaine (XYLOCAINE) 2 % solution Gargle 15 mL every 3 hours as needed. May swallow if desired. Patient not taking: Reported on 03/18/2022 09/13/19   Tommie Sams, DO  benztropine (COGENTIN) 1 MG tablet Take 1 tablet (1 mg total) by mouth daily. 01/21/16 09/13/19  Jimmy Footman, MD    Family History No family history on file.  Social History Social History   Tobacco Use   Smoking status: Every Day    Packs/day: 0.25    Types: Cigarettes   Smokeless tobacco: Never  Vaping Use   Vaping Use: Never used  Substance Use Topics   Alcohol use: No   Drug use: Yes    Types: Marijuana     Allergies   Patient has no known allergies.   Review of Systems Review of Systems: :negative unless otherwise stated in HPI.      Physical Exam Triage Vital Signs ED Triage Vitals  Enc Vitals Group     BP 05/27/22 1131 (!) 136/108     Pulse Rate 05/27/22 1131 (!) 103     Resp 05/27/22 1131 16     Temp 05/27/22 1131 98.3 F (36.8 C)     Temp Source 05/27/22 1131 Oral  SpO2 05/27/22 1131 100 %     Weight 05/27/22 1129 160 lb 0.9 oz (72.6 kg)     Height 05/27/22 1129 5\' 5"  (1.651 m)     Head Circumference --      Peak Flow --      Pain Score 05/27/22 1128 9     Pain Loc --      Pain Edu? --      Excl. in [place]? --    No data found.  Updated Vital Signs BP (!) 136/108 (BP Location: Right Arm)   Pulse (!) 103   Temp 98.3 F (36.8 C) (Oral)   Resp 16   Ht 5\' 5"  (1.651 m)   Wt 72.6 kg   SpO2 100%   BMI 26.63 kg/m   Visual Acuity Right Eye Distance:   Left Eye Distance:   Bilateral Distance:    Right Eye Near:   Left Eye Near:    Bilateral Near:     Physical Exam GEN:     alert, non-ill appearing male in no distress    HENT:  mucus membranes moist, oropharyngeal without lesions or exudate, no tonsillar hypertrophy,  no erythema ,  moderate  erythematous turbinate hypertrophy, clear nasal discharge, left ear deformed, left cochlear implant palpable on the left parietal area, right TM normal EYES:   pupils equal and reactive, no scleral injection NECK:  normal ROM, no meningismus RESP:  no increased work of breathing, clear to auscultation bilaterally CVS:   regular rate and rhythm NEURO: Alert, oriented at baseline, cranial nerves II through XII grossly intact, gross sensation intact, speech at baseline, strength 5/5 bilateral upper and lower extremities Skin:   warm and dry    UC Treatments / Results  Labs (all labs ordered are listed, but only abnormal results are displayed) Labs Reviewed  CBC WITH DIFFERENTIAL/PLATELET - Abnormal; Notable for the following components:      Result Value   RBC 6.43 (*)    Hemoglobin 18.8 (*)    HCT 55.4 (*)    All other components within normal limits  COMPREHENSIVE METABOLIC PANEL - Abnormal; Notable for the following components:   Glucose, Bld 102 (*)    ALT 46 (*)    All other components within normal limits  GROUP A STREP BY PCR  SARS CORONAVIRUS 2 BY RT PCR    EKG   Radiology No results found.  Procedures Procedures (including critical care time)  Medications Ordered in UC Medications - No data to display  Initial Impression / Assessment and Plan / UC Course  I have reviewed the triage vital signs and the nursing notes.  Pertinent labs & imaging results that were available during my care of the patient were reviewed by me and considered in my medical decision making (see chart for details).       Patient is a 31 year old male who presents for sore throat and " shock-like" pains.  Overall patient is well-appearing, well-hydrated, without respiratory distress.  Neurological and cardiopulmonary exams are unremarkable.  COVID and strep were negative.  Patient describes chest discomfort and shortness of breath.  EKG with ST segment changes in V1 through V5 compared to  2014 EKG. Question if he had an acute MI in 2014 as EKG depicts.  He is not aware of MI hx.  Suspect today electrolyte abnormality however there were none seen on CMP.  He denies recent illicit drug use.  Of note, he has history of cocaine abuse.  Admits to  THC use 2 to 3 months ago. Pt requesting oxycodone for his sore throat.    He does have some polycythemia but this is also not new and is stable from prior to 2 months.  Given his acute changes and shortness of breath and described chest discomfort, advised patient to go to the emergency department.  Unfortunately, he has no ride and his brother did not pick up the phone.  EMS was called for transfer to the emergency department however he declined EMS transfer.  Patient signed out AMA.      Final Clinical Impressions(s) / UC Diagnoses   Final diagnoses:  Atypical chest pain  Viral pharyngitis     Discharge Instructions      You have been advised to follow up immediately in the emergency department for concerning signs or symptoms as discussed during your visit. If you declined EMS transport, please have a family member take you directly to the ED at this time. Do not delay.   Based on concerns about condition, if you do not follow up in the ED, you may risk poor outcomes including worsening of condition, delayed treatment and potentially life threatening issues. If you have declined to go to the ED at this time, you should call your PCP immediately to set up a follow up appointment.   Go to ED for red flag symptoms, including; fevers you cannot reduce with Tylenol/Motrin, severe headaches, vision changes, numbness/weakness in part of the body, lethargy, confusion, intractable vomiting, severe dehydration, chest pain, breathing difficulty, severe persistent abdominal or pelvic pain, signs of severe infection (increased redness, swelling of an area), feeling faint or passing out, dizziness, etc. You should especially go to the ED for sudden  acute worsening of condition if you do not elect to go at this time.       ED Prescriptions   None    I have reviewed the PDMP during this encounter.   Katha Cabal, DO 05/27/22 1856

## 2022-05-27 NOTE — ED Triage Notes (Addendum)
Pt c/o sore throat, headache and leg pain. Started about 3 days ago. Denies fever. Pt requesting oxycodone for pain. States he was given ibuprofen last time and it does not work.

## 2022-05-27 NOTE — Discharge Instructions (Addendum)

## 2022-10-26 ENCOUNTER — Emergency Department
Admission: EM | Admit: 2022-10-26 | Discharge: 2022-10-27 | Disposition: A | Discharge status: Home / Self Care | Payer: 59 | Attending: Emergency Medicine | Admitting: Emergency Medicine

## 2022-10-26 DIAGNOSIS — K649 Unspecified hemorrhoids: Secondary | ICD-10-CM | POA: Insufficient documentation

## 2022-10-26 DIAGNOSIS — F17200 Nicotine dependence, unspecified, uncomplicated: Secondary | ICD-10-CM | POA: Diagnosis present

## 2022-10-26 DIAGNOSIS — F1220 Cannabis dependence, uncomplicated: Secondary | ICD-10-CM | POA: Diagnosis present

## 2022-10-26 DIAGNOSIS — F203 Undifferentiated schizophrenia: Secondary | ICD-10-CM | POA: Diagnosis present

## 2022-10-26 DIAGNOSIS — K6289 Other specified diseases of anus and rectum: Secondary | ICD-10-CM | POA: Diagnosis present

## 2022-10-26 DIAGNOSIS — R440 Auditory hallucinations: Secondary | ICD-10-CM | POA: Diagnosis not present

## 2022-10-26 MED ORDER — IBUPROFEN 600 MG PO TABS
600.0000 mg | ORAL_TABLET | Freq: Once | ORAL | Status: AC
  Administered 2022-10-26: 600 mg via ORAL
  Filled 2022-10-26: qty 1

## 2022-10-26 NOTE — ED Provider Notes (Signed)
[facility] Provider Note    Event Date/Time   First MD Initiated Contact with Patient 10/26/22 2301     (approximate)   History   Rectal Pain   HPI  Justin Schaefer is a 32 y.o. male with a history of cocaine abuse and schizophrenia who presents with auditory hallucinations and rectal pain.  He reports he has had auditory hallucinations for several months that have been worsening recently.  He states that the hallucinations consist of general harassment and happen very frequently.  The patient states that the rectal pain started acutely this afternoon.  He reports that he feels like the voices did something to cause the pain.  The patient denies any constipation or straining with bowel movements.  He denies any blood in the stool.  He has no abdominal pain or any nausea or vomiting.  The patient denies any other psychiatric symptoms besides the hallucinations; he has no SI or HI.  I reviewed the past medical records.  The patient most recent encounter was at urgent care in August of last year for headache and bodyaches.  He was most recently seen by psychiatry in June of last year due to auditory hallucinations and did not meet criteria for psychiatric admission at that time.   Physical Exam   Triage Vital Signs: ED Triage Vitals [10/26/22 2303]  Enc Vitals Group     BP      Pulse      Resp      Temp      Temp src      SpO2 98 %     Weight      Height      Head Circumference      Peak Flow      Pain Score      Pain Loc      Pain Edu?      Excl. in [place]?     Most recent vital signs: Vitals:   10/26/22 2303 10/26/22 2309  BP:  (!) 143/96  Pulse:  85  Resp:  18  Temp:  98.8 F (37.1 C)  SpO2: 98% 94%     General: Alert and oriented, no distress. CV:  Good peripheral perfusion.  Resp:  Normal effort.  Abd:  Soft and nontender.  No distention.  Other:  1 cm mildly tender nonthrombosed external hemorrhoid with no evidence of bleeding.   Otherwise normal anal exam.  No visible fissure, swelling, induration, or erythema.  No lesions.   ED Results / Procedures / Treatments   Labs (all labs ordered are listed, but only abnormal results are displayed) Labs Reviewed - No data to display   EKG    RADIOLOGY    PROCEDURES:  Critical Care performed: No  Procedures   MEDICATIONS ORDERED IN ED: Medications  ibuprofen (ADVIL) tablet 600 mg (600 mg Oral Given 10/26/22 2341)     IMPRESSION / MDM / [place] / ED COURSE  I reviewed the triage vital signs and the nursing notes.  32 year old male with a history of schizophrenia presents with anorectal pain as well as with worsening auditory hallucinations recently.  He has no SI/HI and is able to contract for safety.  For the rectal pain, differential diagnosis includes, but is not limited to, external hemorrhoid, proctalgia fugax or other benign etiology.  The worsened auditory hallucinations are consistent with schizophrenia.  The patient denies taking any medication currently.  We will obtain psychiatry consult for further evaluation.  At this time there is no indication for involuntary commitment.  The patient does not demonstrate acute danger to self or others.  Patient's presentation is most consistent with exacerbation of chronic illness.  The patient has been placed in psychiatric observation due to the need to provide a safe environment for the patient while obtaining psychiatric consultation and evaluation, as well as ongoing medical and medication management to treat the patient's condition.  The patient has not been placed under full IVC at this time.   ----------------------------------------- 2:42 AM on 10/27/2022 -----------------------------------------  I consulted and discussed the case with NP Grandville Silos from psychiatry who evaluated the patient.  She does not feel that the patient meets criteria for inpatient admission and recommends  discharge.  On reassessment the patient continues to be calm and cooperative and is behaving appropriately.  He is stable for discharge home.  I have prescribed Preparation H cream.  I gave him return precautions and he expressed understanding.   FINAL CLINICAL IMPRESSION(S) / ED DIAGNOSES   Final diagnoses:  Auditory hallucinations  Hemorrhoids, unspecified hemorrhoid type     Rx / DC Orders   ED Discharge Orders          Ordered    phenylephrine-shark liver oil-mineral oil-petrolatum (PREPARATION H) 0.25-14-74.9 % rectal ointment  2 times daily PRN        10/27/22 0241             Note:  This document was prepared using Dragon voice recognition software and may include unintentional dictation errors.    Arta Silence, MD 10/27/22 [phone]

## 2022-10-26 NOTE — ED Triage Notes (Addendum)
Patient arrived by [facility]. [place] and anus pain. Per patient state hearing voices but not currently but was ealier and feels like the voices did something to his butt cause it is hurting now.  When patient was asked when was his last BM patient states a couple of hours ago. Patient was asked by writer if after that BM is when his butt started hurting he states yes but it wasn't from using the restroom.  Patient requesting some type of spray to help his rectal pain. Patient had 2 lighters and 1 cigarette in pockets.  Patient denies SI/HI, and denies taking any type of medications or being prescribed medications.  Writer removed from patient and placed in bag at nurses station at this time.

## 2022-10-27 DIAGNOSIS — R440 Auditory hallucinations: Secondary | ICD-10-CM

## 2022-10-27 MED ORDER — PREPARATION H 0.25-14-74.9 % RE OINT: 1.0000 | TOPICAL_OINTMENT | Freq: Two times a day (BID) | RECTAL | 0 refills | Status: DC | PRN

## 2022-10-27 NOTE — Discharge Instructions (Signed)
Your rectal pain is caused by a hemorrhoid which is an area of swollen blood vessels.  You may use the prescribed ointment to decrease the pain.  Return to the ER for new or worsening rectal pain or any bleeding, or worsening mental health symptoms including worsening voices, or any thoughts of wanting to harm yourself or others.

## 2022-10-27 NOTE — ED Notes (Signed)
Psych NP and TTS at bedside 

## 2022-10-27 NOTE — ED Notes (Signed)
Patient alert and oriented. Discharge paperwork reviewed with patient. Patient verbally states understanding of discharge papers.  Patient unable to sign discharge.

## 2022-10-27 NOTE — Consult Note (Signed)
[facility] Face-to-Face Psychiatry Consult   Reason for Consult: Psychiatric Evaluation  Referring Physician: Dr. Marisa Severin Patient Identification: Justin Schaefer MRN:  [id number] Principal Diagnosis: <principal problem not specified> Diagnosis:  Active Problems:   Tobacco use disorder severe.   Undifferentiated schizophrenia (HCC)   Cannabis use disorder, moderate, dependence (HCC)   Continuous auditory hallucinations   Total Time spent with patient: 1 hour  Subjective: "I don't take psychiatric medicines because it don't work."    Justin Schaefer is a 32 y.o. male patient presented to [facility] ED via EMS voluntarily. The patient called and voiced that he was experiencing auditory hallucinations and anus pain. The patient stated that he was hearing voices earlier but not anymore. He says that he came to the hospital because his rectum had been hurting him. The patient shared that he was waiting to get a cream for his rectum pain.   This provider saw the patient face-to-face, reviewed the chart, and consulted with Dr. Marisa Severin on 10/27/2022 due to the patient's care. The EDP discussed with the patient that the patient does not meet the criteria for admission to the psychiatric inpatient unit.  On evaluation, the patient is alert and oriented x 4, calm, cooperative, and mood-congruent with affect. The patient does appear to be responding to internal stimuli. Neither is the patient presenting with any delusional thinking. The patient admits to auditory hallucinations but denies visual hallucinations. The patient denies any suicidal, homicidal, or self-harm ideations. The patient is not presenting with any psychotic or paranoid behaviors. During an encounter with the patient, he could answer questions appropriately.  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 No pertinent family history. Family Psychiatric  History:  Social History:  Social History   Substance and Sexual Activity  Alcohol Use No     Social History   Substance and Sexual Activity  Drug Use Yes   Types: Marijuana    Social History   Socioeconomic History   Marital status: Single    Spouse name: Not on file   Number of children: Not on file   Years of education: Not on file   Highest education level: Not on file  Occupational History   Not on file  Tobacco Use   Smoking status: Every Day    Packs/day: 0.25    Types: Cigarettes   Smokeless tobacco: Never  Vaping Use   Vaping Use: Never used  Substance and Sexual Activity   Alcohol use: No   Drug use: Yes    Types: Marijuana   Sexual activity: Never  Other Topics Concern   Not on file  Social History Narrative   Not on file   Social Determinants of Health   Financial Resource Strain: Not on file  Food Insecurity: Not on file  Transportation Needs: Not on file  Physical Activity: Not on file  Stress: Not on file  Social Connections: Not on file   Additional Social History:    Allergies:  No Known Allergies  Labs: No results found for this or any previous visit (from the past 48 hour(s)).  No current facility-administered medications for this encounter.   Current Outpatient Medications  Medication Sig Dispense Refill   ARIPiprazole (ABILIFY) 30 MG tablet Take 1 tablet (30 mg total) by mouth daily. (Patient not taking: Reported on 03/18/2022) 30 tablet 0   ibuprofen (ADVIL) 800 MG tablet Take 1 tablet (800 mg total) by mouth every 8 (eight) hours as needed for moderate pain. (Patient not taking: Reported on 03/18/2022) 30 tablet 0   lidocaine (XYLOCAINE) 2 % solution Gargle 15 mL every 3 hours as  needed. May swallow if desired. (Patient not taking: Reported on 03/18/2022) 200 mL 0    Musculoskeletal: Strength & Muscle Tone: within normal limits Gait & Station: normal Patient leans: N/A Psychiatric Specialty Exam:  Presentation  General Appearance:  Appropriate for Environment  Eye Contact: Good  Speech: Clear and Coherent  Speech Volume: Normal  Handedness: Right   Mood and Affect  Mood: Depressed  Affect: Blunt; Congruent   Thought Process  Thought Processes: Coherent  Descriptions of Associations:Intact  Orientation:Full (Time, Place and Person)  Thought Content:Logical  History of Schizophrenia/Schizoaffective disorder:No  Duration of Psychotic Symptoms:No data recorded Hallucinations:Hallucinations: Auditory Description of Auditory Hallucinations: "I don't know what they are telling me."  Ideas of Reference:Delusions  Suicidal Thoughts:Suicidal Thoughts: No  Homicidal Thoughts:Homicidal Thoughts: No   Sensorium  Memory: Immediate Good; Recent Good; Remote Good  Judgment: Good  Insight: Fair   Community education officer  Concentration: Good  Attention Span: Good  Recall: Good  Fund of Knowledge: Good  Language: Good   Psychomotor Activity  Psychomotor Activity: Psychomotor Activity: Normal   Assets  Assets: Communication Skills; Desire for Improvement; Financial Resources/Insurance; Resilience   Sleep  Sleep: Sleep: Poor Number of Hours of Sleep: 5   Physical Exam: Physical Exam Vitals and nursing note reviewed.  Constitutional:      Appearance: Normal appearance. He is normal weight.  HENT:     Head: Normocephalic and atraumatic.     Nose: Nose normal.     Mouth/Throat:     Mouth: Mucous membranes are moist.  Cardiovascular:     Rate and Rhythm: Normal rate.     Pulses: Normal pulses.  Pulmonary:     Effort: Pulmonary effort is normal.  Musculoskeletal:        General: Normal range of motion.      Cervical back: Normal range of motion and neck supple.  Neurological:     General: No focal deficit present.     Mental Status: He is alert and oriented to person, place, and time.  Psychiatric:        Attention and Perception: Attention and perception normal.        Mood and Affect: Affect is blunt.        Speech: Speech normal.        Behavior: Behavior normal. Behavior is cooperative.        Thought Content: Thought content normal.        Cognition and Memory: Cognition and memory normal.        Judgment: Judgment is inappropriate.    Review of Systems  Psychiatric/Behavioral:  Positive for depression and hallucinations. The patient has insomnia.   All other systems reviewed and are negative.  Blood pressure (!) 143/96, pulse 85, temperature 98.8 F (37.1 C), temperature source Oral, resp. rate 18, height 5\' 5"  (1.651 m), weight 68 kg, SpO2 94 %. Body mass index is 24.96 kg/m.  Treatment Plan Summary: Plan   Patient does not meet  the criteria for psychiatric inpatient admission  Disposition: No evidence of imminent risk to self or others at present.   Patient does not meet criteria for psychiatric inpatient admission. Supportive therapy provided about ongoing stressors. Discussed crisis plan, support from social network, calling 911, coming to the Emergency Department, and calling Suicide Hotline.  Caroline Sauger, NP 10/27/2022 2:07 AM

## 2023-02-07 ENCOUNTER — Other Ambulatory Visit: Payer: Self-pay

## 2023-02-07 ENCOUNTER — Emergency Department
Admission: EM | Admit: 2023-02-07 | Discharge: 2023-02-08 | Disposition: A | Discharge status: Other Acute Inpatient Hospital | Payer: 59 | Attending: Emergency Medicine | Admitting: Emergency Medicine

## 2023-02-07 DIAGNOSIS — F23 Brief psychotic disorder: Secondary | ICD-10-CM | POA: Diagnosis not present

## 2023-02-07 DIAGNOSIS — F203 Undifferentiated schizophrenia: Secondary | ICD-10-CM | POA: Diagnosis present

## 2023-02-07 DIAGNOSIS — F17210 Nicotine dependence, cigarettes, uncomplicated: Secondary | ICD-10-CM | POA: Diagnosis not present

## 2023-02-07 DIAGNOSIS — R440 Auditory hallucinations: Secondary | ICD-10-CM | POA: Diagnosis present

## 2023-02-07 DIAGNOSIS — F1410 Cocaine abuse, uncomplicated: Secondary | ICD-10-CM | POA: Diagnosis present

## 2023-02-07 LAB — URINE DRUG SCREEN, QUALITATIVE (ARMC ONLY)
Amphetamines, Ur Screen: NOT DETECTED
Barbiturates, Ur Screen: NOT DETECTED
Benzodiazepine, Ur Scrn: NOT DETECTED
Cannabinoid 50 Ng, Ur ~~LOC~~: NOT DETECTED
Cocaine Metabolite,Ur ~~LOC~~: POSITIVE — AB
MDMA (Ecstasy)Ur Screen: NOT DETECTED
Methadone Scn, Ur: NOT DETECTED
Opiate, Ur Screen: NOT DETECTED
Phencyclidine (PCP) Ur S: NOT DETECTED
Tricyclic, Ur Screen: NOT DETECTED

## 2023-02-07 LAB — COMPREHENSIVE METABOLIC PANEL
ALT: 33 U/L (ref 0–44)
AST: 20 U/L (ref 15–41)
Albumin: 4.5 g/dL (ref 3.5–5.0)
Alkaline Phosphatase: 56 U/L (ref 38–126)
Anion gap: 7 (ref 5–15)
BUN: 13 mg/dL (ref 6–20)
CO2: 30 mmol/L (ref 22–32)
Calcium: 9.3 mg/dL (ref 8.9–10.3)
Chloride: 100 mmol/L (ref 98–111)
Creatinine, Ser: 0.83 mg/dL (ref 0.61–1.24)
GFR, Estimated: >60 mL/min (ref >60)
Glucose, Bld: 102 mg/dL — ABNORMAL HIGH (ref 70–99)
Potassium: 4.4 mmol/L (ref 3.5–5.1)
Sodium: 137 mmol/L (ref 135–145)
Total Bilirubin: 0.6 mg/dL (ref 0.3–1.2)
Total Protein: 7.8 g/dL (ref 6.5–8.1)

## 2023-02-07 LAB — CBC
HCT: 54.8 % — ABNORMAL HIGH (ref 39.0–52.0)
Hemoglobin: 18.2 g/dL — ABNORMAL HIGH (ref 13.0–17.0)
MCH: 29 pg (ref 26.0–34.0)
MCHC: 33.2 g/dL (ref 30.0–36.0)
MCV: 87.4 fL (ref 80.0–100.0)
Platelets: 209 10*3/uL (ref 150–400)
RBC: 6.27 MIL/uL — ABNORMAL HIGH (ref 4.22–5.81)
RDW: 12.2 % (ref 11.5–15.5)
WBC: 9 10*3/uL (ref 4.0–10.5)
nRBC: 0 % (ref 0.0–0.2)

## 2023-02-07 LAB — ACETAMINOPHEN LEVEL: Acetaminophen (Tylenol), Serum: <10 ug/mL — ABNORMAL LOW (ref 10–30)

## 2023-02-07 LAB — SALICYLATE LEVEL: Salicylate Lvl: <7 mg/dL — ABNORMAL LOW (ref 7.0–30.0)

## 2023-02-07 LAB — ETHANOL: Alcohol, Ethyl (B): <10 mg/dL (ref <10)

## 2023-02-07 MED ORDER — OLANZAPINE 10 MG PO TABS
10.0000 mg | ORAL_TABLET | Freq: Every day | ORAL | Status: DC
  Administered 2023-02-07: 10 mg via ORAL
  Filled 2023-02-07: qty 1

## 2023-02-07 MED ORDER — OLANZAPINE 5 MG PO TBDP
10.0000 mg | ORAL_TABLET | Freq: Once | ORAL | Status: AC
  Administered 2023-02-07: 10 mg via ORAL
  Filled 2023-02-07: qty 2

## 2023-02-07 MED ORDER — ACETAMINOPHEN 325 MG PO TABS
650.0000 mg | ORAL_TABLET | Freq: Four times a day (QID) | ORAL | Status: DC | PRN
  Administered 2023-02-07: 650 mg via ORAL
  Filled 2023-02-07: qty 2

## 2023-02-07 NOTE — ED Triage Notes (Signed)
Pt presents with his brother. Pt states he can't explain why he is here. Pt's brother states that pt has schizophrenia and pt has been hearing voices and then was trying to break in to their dad's room today. Pt appears to be responding to visual internal stimuli. Pt admits to visual and auditory hallucinations. Denies command hallucinations. Denies SI/HI.

## 2023-02-07 NOTE — ED Notes (Signed)
EDP Bradler to bedside.

## 2023-02-07 NOTE — ED Notes (Signed)
See triage note. Pt's brother allowed to remain at bedside until provider Bradler able to talk with pt and brother in person as pt hearing impaired and brother concerned he will not be able to understand and communicate well enough with provider. Brother states pt had coclear implant for R ear but that he doesn't use it and cannot hear from that ear. States best to speak into pt's left ear and look directly at pt while talking. States pt does not sign or need interpreter. Pt calm and cooperative. Brother states pt has had to come in a couple times in past to get psych eval completed and stayed for usually 2 weeks at a time. Brother states pt has been better recently about identifying for himself when he is spiraling and needs to seek help.

## 2023-02-07 NOTE — ED Notes (Signed)
This RN introduced herself to pt, pt shown room and explained about security cameras and bathroom. RN placed requested blankets and water in room while pt used restroom, pt told requested things were in room.

## 2023-02-07 NOTE — ED Notes (Signed)
Pt belongings are:  A pair of black sneakers A pair of white socks A grey tee shirt  Maroon pants Blue underwear A cell phone  Photo ID

## 2023-02-07 NOTE — BH Assessment (Addendum)
Patient has been accepted to [facility].  Patient assigned to room TBD Accepting physician is Dr. Jaclynn Major.  Call report to [phone].  Representative was [employer].   ER Staff is aware of it:  [place] Ana, ER Secretary  Dr. Derrill Kay, ER MD  [place], Patient's Nurse     Patient's Family/Support System [facility] [place] Hutchins: [phone]) have been updated as well.    Patient can transport tomorrow (02/08/23) after 7:00am.    Address:  [address] [place], [place] [address]

## 2023-02-07 NOTE — BH Assessment (Signed)
Per [facility]), patient to be referred out of system.  Referral information for Psychiatric Hospitalization faxed to;   [facility] [phone])  [facility] [phone] -or- [phone])  [facility] [phone])  [place] [phone])  [facility] [phone])  [facility] [phone])

## 2023-02-07 NOTE — ED Notes (Signed)
Pt given cup of water, apple juice and snack. Will provide food tray once one available. Pt appreciative for drinks and snack.
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
## 2023-02-07 NOTE — ED Notes (Signed)
Waiting for dietary to bring appropriate dinner tray for patient.

## 2023-02-07 NOTE — ED Notes (Signed)
Hospital meal provided, pt tolerated w/o complaints.  Waste discarded appropriately.  

## 2023-02-07 NOTE — ED Provider Notes (Signed)
[facility] Provider Note   Event Date/Time   First MD Initiated Contact with Patient 02/07/23 1208     (approximate) History  Psychiatric Evaluation (/)  HPI Justin Schaefer is a 32 y.o. male with a past medical history of schizophrenia who has been off of his medications for over a year who is complaining of worsening auditory and visual hallucinations.  Patient states that he has not taken his medication as he "does not like the way it makes me feel". ROS: Unable to assess   Physical Exam  Triage Vital Signs: ED Triage Vitals  Enc Vitals Group     BP 02/07/23 1149 (!) 135/93     Pulse Rate 02/07/23 1149 60     Resp 02/07/23 1149 20     Temp 02/07/23 1149 98 F (36.7 C)     Temp Source 02/07/23 1149 Oral     SpO2 02/07/23 1149 97 %     Weight 02/07/23 1150 140 lb (63.5 kg)     Height 02/07/23 1150 5\' 5"  (1.651 m)     Head Circumference --      Peak Flow --      Pain Score --      Pain Loc --      Pain Edu? --      Excl. in GC? --    Most recent vital signs: Vitals:   02/07/23 1149  BP: (!) 135/93  Pulse: 60  Resp: 20  Temp: 98 F (36.7 C)  SpO2: 97%   General: Awake, oriented x4. CV:  Good peripheral perfusion.  Resp:  Normal effort.  Abd:  No distention.  Other:  Well-developed, well-nourished middle-aged Hispanic male sitting in stretcher in no acute distress ED Results / Procedures / Treatments  Labs (all labs ordered are listed, but only abnormal results are displayed) Labs Reviewed  COMPREHENSIVE METABOLIC PANEL - Abnormal; Notable for the following components:      Result Value   Glucose, Bld 102 (*)    All other components within normal limits  SALICYLATE LEVEL - Abnormal; Notable for the following components:   Salicylate Lvl <7.0 (*)    All other components within normal limits  ACETAMINOPHEN LEVEL - Abnormal; Notable for the following components:   Acetaminophen (Tylenol), Serum <10 (*)    All other components within  normal limits  CBC - Abnormal; Notable for the following components:   RBC 6.27 (*)    Hemoglobin 18.2 (*)    HCT 54.8 (*)    All other components within normal limits  URINE DRUG SCREEN, QUALITATIVE ([facility] ONLY) - Abnormal; Notable for the following components:   Cocaine Metabolite,Ur [place] POSITIVE (*)    All other components within normal limits  ETHANOL  PROCEDURES: Critical Care performed: No Procedures MEDICATIONS ORDERED IN ED: Medications  OLANZapine (ZYPREXA) tablet 10 mg (has no administration in time range)  OLANZapine zydis (ZYPREXA) disintegrating tablet 10 mg (10 mg Oral Given 02/07/23 1214)   IMPRESSION / MDM / ASSESSMENT AND PLAN / ED COURSE  I reviewed the triage vital signs and the nursing notes.                             Patient's presentation is most consistent with acute presentation with potential threat to life or bodily function. Patient presents under IVC for hallucinations/delusions. Thoughts are disorganized. No history of prior suicide attempt, and no SI or HI at this  time. Clinically w/ no overt toxidrome, low suspicion for ingestion given hx and exam Thoughts unlikely 2/2 anemia, hypothyroidism, infection, or ICH. Patients decision making capacity is compromised and they are unable to perform all ADLs (additionally they are without appropriate caretakers to assist through this deficit).  Consult: Psychiatry to evaluate patient for grave disability Disposition: Pending psychiatric evaluation  Care of this patient will be signed out the oncoming physician.  All pertinent patient formation is conveyed and all questions answered.  All further care and disposition decisions will be made by the oncoming physician.   FINAL CLINICAL IMPRESSION(S) / ED DIAGNOSES   Final diagnoses:  Acute psychosis (HCC)   Rx / DC Orders   ED Discharge Orders     None      Note:  This document was prepared using Dragon voice recognition software and may include  unintentional dictation errors.   Merwyn Katos, MD 02/07/23 [phone]

## 2023-02-07 NOTE — ED Notes (Signed)
Pt given nighttime snack. 

## 2023-02-07 NOTE — ED Notes (Signed)
Pt steady and c/c with staff upon arrival to room. Pt's brother visiting briefly. Maria NT confirms both urine and blood taken during triage and sent to lab.

## 2023-02-07 NOTE — ED Notes (Signed)
Psych NP and other psych staff member talking with pt at bedside.

## 2023-02-08 NOTE — ED Notes (Signed)
EMTALA reviewed by this RN, pt okay for transport

## 2023-02-08 NOTE — ED Notes (Signed)
EMTALA reviewed by this RN.  

## 2023-02-08 NOTE — ED Provider Notes (Signed)
Emergency Medicine Observation Re-evaluation Note  Justin Schaefer is a 32 y.o. male, seen on rounds today.  Pt initially presented to the ED for complaints of Psychiatric Evaluation (/) Currently, the patient is sleeping comfortably, no issues overnight per nursing staff.  Physical Exam  BP 104/73 (BP Location: Left Arm)   Pulse 72   Temp 98 F (36.7 C) (Oral)   Resp 18   Ht 5\' 5"  (1.651 m)   Wt 63.5 kg   SpO2 96%   BMI 23.30 kg/m  Physical Exam  The patient is comfortable appearing and in no acute distress.   ED Course / MDM  EKG:   I have reviewed the labs performed to date as well as medications administered while in observation.  Recent changes in the last 24 hours include none.  Plan  Current plan is for psychiatric admission.    Chesley Noon, MD 02/08/23 [phone]

## 2023-02-08 NOTE — ED Notes (Signed)
Pt signed rider waiver and is being transported to [facility] by [employer].

## 2023-02-08 NOTE — ED Notes (Signed)
Breakfast tray given to pt
